# Patient Record
Sex: Female | Born: 1960 | Race: White | Hispanic: No | Smoking: Current some day smoker
Health system: Southern US, Community
[De-identification: ages and names within clinical notes are randomized; demographics above are authoritative.]

## PROBLEM LIST (undated history)

## (undated) ENCOUNTER — Emergency Department (HOSPITAL_BASED_OUTPATIENT_CLINIC_OR_DEPARTMENT_OTHER): Admission: EM | Discharge status: Absent without leave

## (undated) DIAGNOSIS — E079 Disorder of thyroid, unspecified: Secondary | ICD-10-CM

## (undated) DIAGNOSIS — Z87440 Personal history of urinary (tract) infections: Secondary | ICD-10-CM

## (undated) HISTORY — PX: BIOPSY BREAST: PRO8

---

## 2011-03-03 ENCOUNTER — Emergency Department (HOSPITAL_BASED_OUTPATIENT_CLINIC_OR_DEPARTMENT_OTHER)
Admission: EM | Admit: 2011-03-03 | Discharge: 2011-03-03 | Disposition: A | Discharge status: Home / Self Care | Attending: Emergency Medicine | Admitting: Emergency Medicine

## 2011-03-03 DIAGNOSIS — N390 Urinary tract infection, site not specified: Secondary | ICD-10-CM | POA: Insufficient documentation

## 2011-03-03 DIAGNOSIS — R350 Frequency of micturition: Secondary | ICD-10-CM | POA: Insufficient documentation

## 2011-03-03 LAB — PREGNANCY, URINE: Preg Test, Ur: NEGATIVE

## 2011-03-03 LAB — URINE MICROSCOPIC-ADD ON

## 2011-03-03 LAB — URINALYSIS, ROUTINE W REFLEX MICROSCOPIC
Bilirubin Urine: NEGATIVE
Nitrite: NEGATIVE
Protein, ur: NEGATIVE mg/dL
Specific Gravity, Urine: 1.005 (ref 1.005–1.030)
Urobilinogen, UA: 1 mg/dL (ref 0.0–1.0)

## 2011-03-03 MED ORDER — NITROFURANTOIN MONOHYD MACRO 100 MG PO CAPS: 100.0000 mg | ORAL_CAPSULE | Freq: Two times a day (BID) | ORAL | Status: AC

## 2011-03-03 MED ORDER — NITROFURANTOIN MONOHYD MACRO 100 MG PO CAPS
100.0000 mg | ORAL_CAPSULE | Freq: Once | ORAL | Status: AC
  Administered 2011-03-03: 100 mg via ORAL
  Filled 2011-03-03: qty 1

## 2011-03-03 NOTE — ED Provider Notes (Signed)
History  Scribed for [employer]. Yolanda Duel, MD, the patient was seen in [facility]. The chart was scribed by Gilman Schmidt. The patients care was started at 11:11 PM.   CSN: [id number] Arrival date & time: 03/03/2011 10:47 PM   First MD Initiated Contact with Patient 03/03/11 2305      Chief Complaint  Patient presents with  . Urinary Frequency     HPI Yolanda Carr is a 50 y.o. female who presents to the Emergency Department complaining of urinary frequency onset 7 hours ago. Additionally notes pressure on bladder. Denies any discomfort, N/V, fever, back pain, or any other sx. Denies any chance of pregnancy. Pt does report Sulfa allergies. There are no other associated symptoms and no other alleviating or aggravating factors.      History reviewed. No pertinent past medical history.  Past Surgical History  Procedure Date  . Cesarean section 1996    History reviewed. No pertinent family history.  History  Substance Use Topics  . Smoking status: Never Smoker   . Smokeless tobacco: Never Used  . Alcohol Use: 1.2 oz/week    2 Cans of beer per week    OB History    Grav Para Term Preterm Abortions TAB SAB Ect Mult Living                  Review of Systems  Constitutional: Negative for fever and activity change.  Cardiovascular: Negative for chest pain.  Gastrointestinal: Negative for nausea, vomiting, diarrhea and abdominal distention.  Genitourinary: Positive for frequency. Negative for dysuria, decreased urine volume and difficulty urinating.  Musculoskeletal: Negative for back pain.  Neurological: Negative for headaches.  All other systems reviewed and are negative.    Allergies  Sulfa antibiotics  Home Medications   Current Outpatient Rx  Name Route Sig Dispense Refill  . CYSTEX PO Oral Take 2 tablets by mouth once as needed. For UTI symptoms      . TAMOXIFEN CITRATE 10 MG PO TABS Oral Take 10 mg by mouth 2 (two) times daily.        BP 107/74  Pulse 94   Temp(Src) 98.5 F (36.9 C) (Oral)  Resp 16  Ht 5\' 4"  (1.626 m)  Wt 170 lb (77.111 kg)  BMI 29.18 kg/m2  SpO2 96%  LMP 03/03/2011  Physical Exam  Constitutional: She is oriented to person, place, and time. She appears well-developed and well-nourished.  Non-toxic appearance. She does not have a sickly appearance.       Jovial NAD  HENT:  Head: Normocephalic and atraumatic.  Eyes: Conjunctivae, EOM and lids are normal. Pupils are equal, round, and reactive to light. No scleral icterus.  Neck: Trachea normal and normal range of motion. Neck supple.  Cardiovascular: Normal rate, regular rhythm and normal heart sounds.   Pulmonary/Chest: Effort normal and breath sounds normal.  Abdominal: Soft. Normal appearance. There is no tenderness. There is no rebound, no guarding and no CVA tenderness.  Musculoskeletal: Normal range of motion. She exhibits no edema and no tenderness.  Neurological: She is alert and oriented to person, place, and time. She has normal strength.  Skin: Skin is warm, dry and intact. No rash noted.    ED Course  Procedures  DIAGNOSTIC STUDIES: Oxygen Saturation is 96% on room air, normal by my interpretation.    COORDINATION OF CARE: 11:11pm:  - Patient evaluated by ED physician, Macrobid, Pregnancy urine, UA, ordered   Results for orders placed during the hospital encounter of 03/03/11  URINALYSIS, ROUTINE W REFLEX MICROSCOPIC      Component Value Range   Color, Urine RED (*) YELLOW    Appearance CLOUDY (*) CLEAR    Specific Gravity, Urine 1.005  1.005 - 1.030    pH 6.5  5.0 - 8.0    Glucose, UA NEGATIVE  NEGATIVE (mg/dL)   Hgb urine dipstick LARGE (*) NEGATIVE    Bilirubin Urine NEGATIVE  NEGATIVE    Ketones, ur NEGATIVE  NEGATIVE (mg/dL)   Protein, ur NEGATIVE  NEGATIVE (mg/dL)   Urobilinogen, UA 1.0  0.0 - 1.0 (mg/dL)   Nitrite NEGATIVE  NEGATIVE    Leukocytes, UA LARGE (*) NEGATIVE   PREGNANCY, URINE      Component Value Range   Preg Test, Ur  NEGATIVE    URINE MICROSCOPIC-ADD ON      Component Value Range   Squamous Epithelial / LPF FEW (*) RARE    WBC, UA 21-50  <3 (WBC/hpf)   RBC / HPF 11-20  <3 (RBC/hpf)   Bacteria, UA MANY (*) RARE       MDM      I personally performed the services described in this documentation, which was scribed in my presence.  The recorded information has been reviewed and considered. Zylie Mumaw A. Yolanda Duel, MD.       Lorelle Gibbs. Yolanda Duel, MD 03/04/11 1478

## 2011-03-03 NOTE — ED Notes (Signed)
Pt states that she did a test at home for urinary tract infection, said that she had one, here for urinary frequency and discomfort.

## 2011-08-23 ENCOUNTER — Emergency Department (HOSPITAL_BASED_OUTPATIENT_CLINIC_OR_DEPARTMENT_OTHER)
Admission: EM | Admit: 2011-08-23 | Discharge: 2011-08-23 | Disposition: A | Discharge status: Home / Self Care | Attending: Emergency Medicine | Admitting: Emergency Medicine

## 2011-08-23 ENCOUNTER — Encounter (HOSPITAL_BASED_OUTPATIENT_CLINIC_OR_DEPARTMENT_OTHER): Payer: Self-pay | Admitting: Emergency Medicine

## 2011-08-23 DIAGNOSIS — N760 Acute vaginitis: Secondary | ICD-10-CM | POA: Insufficient documentation

## 2011-08-23 DIAGNOSIS — A499 Bacterial infection, unspecified: Secondary | ICD-10-CM | POA: Insufficient documentation

## 2011-08-23 DIAGNOSIS — B9689 Other specified bacterial agents as the cause of diseases classified elsewhere: Secondary | ICD-10-CM | POA: Insufficient documentation

## 2011-08-23 DIAGNOSIS — R350 Frequency of micturition: Secondary | ICD-10-CM | POA: Insufficient documentation

## 2011-08-23 DIAGNOSIS — Z79899 Other long term (current) drug therapy: Secondary | ICD-10-CM | POA: Insufficient documentation

## 2011-08-23 DIAGNOSIS — N921 Excessive and frequent menstruation with irregular cycle: Secondary | ICD-10-CM | POA: Insufficient documentation

## 2011-08-23 DIAGNOSIS — N949 Unspecified condition associated with female genital organs and menstrual cycle: Secondary | ICD-10-CM | POA: Insufficient documentation

## 2011-08-23 LAB — URINALYSIS, ROUTINE W REFLEX MICROSCOPIC
Hgb urine dipstick: NEGATIVE
Ketones, ur: 15 mg/dL — AB
Protein, ur: 30 mg/dL — AB
Urobilinogen, UA: 1 mg/dL (ref 0.0–1.0)

## 2011-08-23 LAB — URINE MICROSCOPIC-ADD ON

## 2011-08-23 LAB — WET PREP, GENITAL
Trich, Wet Prep: NONE SEEN
Yeast Wet Prep HPF POC: NONE SEEN

## 2011-08-23 LAB — PREGNANCY, URINE: Preg Test, Ur: NEGATIVE

## 2011-08-23 MED ORDER — FLUCONAZOLE 50 MG PO TABS
150.0000 mg | ORAL_TABLET | Freq: Once | ORAL | Status: AC
  Administered 2011-08-23: 150 mg via ORAL
  Filled 2011-08-23: qty 1

## 2011-08-23 MED ORDER — METRONIDAZOLE 500 MG PO TABS: 500.0000 mg | ORAL_TABLET | Freq: Two times a day (BID) | ORAL | Status: AC

## 2011-08-23 MED ORDER — METRONIDAZOLE 500 MG PO TABS
500.0000 mg | ORAL_TABLET | Freq: Once | ORAL | Status: AC
  Administered 2011-08-23: 500 mg via ORAL
  Filled 2011-08-23: qty 1

## 2011-08-23 NOTE — Discharge Instructions (Signed)
Bacterial Vaginosis Bacterial vaginosis (BV) is a vaginal infection where the normal balance of bacteria in the vagina is disrupted. The normal balance is then replaced by an overgrowth of certain bacteria. There are several different kinds of bacteria that can cause BV. BV is the most common vaginal infection in women of childbearing age. CAUSES   The cause of BV is not fully understood. BV develops when there is an increase or imbalance of harmful bacteria.   Some activities or behaviors can upset the normal balance of bacteria in the vagina and put women at increased risk including:   Having a new sex partner or multiple sex partners.   Douching.   Using an intrauterine device (IUD) for contraception.   It is not clear what role sexual activity plays in the development of BV. However, women that have never had sexual intercourse are rarely infected with BV.  Women do not get BV from toilet seats, bedding, swimming pools or from touching objects around them.  SYMPTOMS   Grey vaginal discharge.   A fish-like odor with discharge, especially after sexual intercourse.   Itching or burning of the vagina and vulva.   Burning or pain with urination.   Some women have no signs or symptoms at all.  DIAGNOSIS  Your caregiver must examine the vagina for signs of BV. Your caregiver will perform lab tests and look at the sample of vaginal fluid through a microscope. They will look for bacteria and abnormal cells (clue cells), a pH test higher than 4.5, and a positive amine test all associated with BV.  RISKS AND COMPLICATIONS   Pelvic inflammatory disease (PID).   Infections following gynecology surgery.   Developing HIV.   Developing herpes virus.  TREATMENT  Sometimes BV will clear up without treatment. However, all women with symptoms of BV should be treated to avoid complications, especially if gynecology surgery is planned. Female partners generally do not need to be treated. However,  BV may spread between female sex partners so treatment is helpful in preventing a recurrence of BV.   BV may be treated with antibiotics. The antibiotics come in either pill or vaginal cream forms. Either can be used with nonpregnant or pregnant women, but the recommended dosages differ. These antibiotics are not harmful to the baby.   BV can recur after treatment. If this happens, a second round of antibiotics will often be prescribed.   Treatment is important for pregnant women. If not treated, BV can cause a premature delivery, especially for a pregnant woman who had a premature birth in the past. All pregnant women who have symptoms of BV should be checked and treated.   For chronic reoccurrence of BV, treatment with a type of prescribed gel vaginally twice a week is helpful.  HOME CARE INSTRUCTIONS   Finish all medication as directed by your caregiver.   Do not have sex until treatment is completed.   Tell your sexual partner that you have a vaginal infection. They should see their caregiver and be treated if they have problems, such as a mild rash or itching.   Practice safe sex. Use condoms. Only have 1 sex partner.  PREVENTION  Basic prevention steps can help reduce the risk of upsetting the natural balance of bacteria in the vagina and developing BV:  Do not have sexual intercourse (be abstinent).   Do not douche.   Use all of the medicine prescribed for treatment of BV, even if the signs and symptoms go away.     Tell your sex partner if you have BV. That way, they can be treated, if needed, to prevent reoccurrence.  SEEK MEDICAL CARE IF:   Your symptoms are not improving after 3 days of treatment.   You have increased discharge, pain, or fever.  MAKE SURE YOU:   Understand these instructions.   Will watch your condition.   Will get help right away if you are not doing well or get worse.  FOR MORE INFORMATION  Division of STD Prevention (DSTDP), Centers for Disease  Control and Prevention: SolutionApps.co.za American Social Health Association (ASHA): www.ashastd.org  Document Released: 04/02/2005 Document Revised: 03/22/2011 Document Reviewed: 09/23/2008 [facility] Patient Information 2012 [place], [place].Metrorrhagia  Metrorrhagia is uterine bleeding at irregular intervals, especially between menstrual periods.  CAUSES   Dysfunctional uterine bleeding.   Uterine lining growing outside the uterus (endometriosis).   Embryo adhering to uterine wall (implantation).   Pregnancy growing in the fallopian tubes (ectopic pregnancy).   Miscarriage.   Menopause.   Cancer of the reproduction organs.   Certain drugs such as hormonal contraceptives.   Inherited bleeding disorders.   Trauma.   Uterine fibroids.   Sexually transmitted diseases (STDs).   Polycystic ovarian disease.  DIAGNOSIS  A history will be taken.   A physical exam will be performed.   Other tests may include:   Blood tests.   A pregnancy test.   An ultrasound of the abdomen and pelvis.   A biopsy of the uterine lining.   AMRI or CT scan of the abdomen and pelvis.  TREATMENT Treatment will depend on the cause. HOME CARE INSTRUCTIONS   Take all medicines as directed by your caregiver. Do not change or switch medicines without talking to your caregiver.   Take all iron supplements exactly as directed by your caregiver. Iron supplements help to replace the iron your body loses from irregular bleeding.If you become constipated, increase the amount of fiber, fruits, and vegetables in your diet.   Do not take aspirin or medicines that contain aspirin for 1 week before your menstrual period or during your menstrual period. Aspirin may increase the bleeding.   Rest as much as possible if you change your sanitary pad or tampon more than once every 2 hours.   Eat well-balanced meals including foods high in iron, such as green leafy vegetables, red meat, liver, eggs, and  whole-grain breads and cereals.   Do not try to lose weight until the abnormal bleeding is controlled and your blood iron level is back to normal.  SEEK MEDICAL CARE IF:   You have nausea and vomiting, or you cannot keep foods down.   You feel dizzy or have diarrhea while taking medicine.   You have any problems that may be related to the medicine you are taking.  SEEK IMMEDIATE MEDICAL CARE IF:   You have a fever.   You develop chills.   You become lightheaded or faint.   You need to change your sanitary pad or tampon more than once an hour.   Your bleeding becomesheavy.   You begin to pass clots or tissue.  MAKE SURE YOU:   Understand these instructions.   Will watch your condition.   Will get help right away if you are not doing well or get worse.  Document Released: 04/02/2005 Document Revised: 03/22/2011 Document Reviewed: 10/30/2010 [facility] Patient Information 2012 [place], [place].Vaginitis Vaginitis is an infection. It causes soreness, swelling, and redness (inflammation) of the vagina. Many of these infections are sexually transmitted  diseases (STDs). Having unprotected sex can cause further problems and complications such as:  Chronic pelvic pain.   Infertility.   Unwanted pregnancy.   Abortion.   Tubal pregnancy.   Infection passed on to the newborn.   Cancer.  CAUSES   Monilia. This is a yeast or fungus infection, not an STD.   Bacterial vaginosis. The normal balance of bacteria in the vagina is disrupted and is replaced by an overgrowth of certain bacteria.   Gonorrhea, chlamydia. These are bacterial infections that are STDs.   Vaginal sponges, diaphragms, and intrauterine devices.   Trichomoniasis. This is a STD infection caused by a parasite.   Viruses like herpes and human papillomavirus. Both are STDs.   Pregnancy.   Immunosuppression. This occurs with certain conditions such as HIV infection or cancer.   Using bubble bath.   Taking  certain antibiotic medicines.   Sporadic recurrence can occur if you become sick.   Diabetes.   Steroids.   Allergic reaction. If you have an allergy to:   Douches.   Soaps.   Spermicides.   Condoms.   Scented tampons or vaginal sprays.  SYMPTOMS   Abnormal vaginal discharge.   Itching of the vagina.   Pain in the vagina.   Swelling of the vagina.  In some cases, there are no symptoms. TREATMENT  Treatment will vary depending on the type of infection.  Bacteria or trichomonas are usually treated with oral antibiotics and sometimes vaginal cream or suppositories.   Monilia vaginitis is usually treated with vaginal creams, suppositories, or oral antifungal pills.   Viral vaginitis has no cure. However, the symptoms of herpes (a viral vaginitis) can be treated to relieve the discomfort. Human papillomavirus has no symptoms. However, there are treatments for the diseases caused by human papillomavirus.   With allergic vaginitis, you need to stop using the product that is causing the problem. Vaginal creams can be used to treat the symptoms.   When treating an STD, the sex partner should also be treated.  HOME CARE INSTRUCTIONS   Take all the medicines as directed by your caregiver.   Do not use scented tampons, soaps, or vaginal sprays.   Do not douche.   Tell your sex partner if you have a vaginal infection or an STD.   Do not have sexual intercourse until you have treated the vaginitis.   Practice safe sex by using condoms.  SEEK MEDICAL CARE IF:   You have abdominal pain.   Your symptoms get worse during treatment.  Document Released: 01/28/2007 Document Revised: 03/22/2011 Document Reviewed: 09/23/2008 [facility] Patient Information 2012 [place], [place].

## 2011-08-23 NOTE — ED Notes (Signed)
Pt amb to room 7 with quick steady gait smiling in nad. Pt reports her period is 2 weeks late, and wonders if she is pregnant. Also reports dysuria, and some white vaginal discharge.

## 2011-08-23 NOTE — ED Provider Notes (Signed)
History     CSN: [id number]  Arrival date & time 08/23/11  1125   First MD Initiated Contact with Patient 08/23/11 1132      Chief Complaint  Patient presents with  . Dysuria    (Consider location/radiation/quality/duration/timing/severity/associated sxs/prior treatment) Patient is a 51 y.o. female presenting with vaginal discharge.  Vaginal Discharge This is a new problem. The current episode started more than 2 days ago. The problem occurs constantly. The problem has not changed since onset.Pertinent negatives include no abdominal pain. The symptoms are aggravated by nothing. The symptoms are relieved by nothing. She has tried nothing for the symptoms.   patient complains of 4/10 vaginal discomfort. She notes that she feels that her external vaginal area is swollen. Patient has had the same partner for some time and is not concerned that she did have a sexual transmitted disease. She does note that her husband had similar symptoms and treated himself with nystatin powder and it seemed to have helped. Patient's vaginal discharge is noted to be slightly whitish. She denies any increased urinary frequency but did note some discomfort on urination just today. Patient denies any back or abdominal pain with this. She states that her period is 2 weeks late which is very unusual for her. She also notes having hot and cold flashes. Patient has not gone through menopause as far she knows. There are no other associated or modifying factors.  History reviewed. No pertinent past medical history.  Past Surgical History  Procedure Date  . Cesarean section 1996    History reviewed. No pertinent family history.  History  Substance Use Topics  . Smoking status: Never Smoker   . Smokeless tobacco: Never Used  . Alcohol Use: 1.2 oz/week    2 Cans of beer per week    OB History    Grav Para Term Preterm Abortions TAB SAB Ect Mult Living                  Review of Systems  Constitutional:  Negative.   HENT: Negative.   Eyes: Negative.   Respiratory: Negative.   Cardiovascular: Negative.   Gastrointestinal: Negative.  Negative for abdominal pain.  Genitourinary: Positive for dysuria and vaginal discharge.  Musculoskeletal: Negative.   Neurological: Negative.   Hematological: Negative.   Psychiatric/Behavioral: Negative.   All other systems reviewed and are negative.    Allergies  Sulfa antibiotics  Home Medications   Current Outpatient Rx  Name Route Sig Dispense Refill  . CYSTEX PO Oral Take 2 tablets by mouth once as needed. For UTI symptoms      . METRONIDAZOLE 500 MG PO TABS Oral Take 1 tablet (500 mg total) by mouth 2 (two) times daily. 14 tablet 0  . TAMOXIFEN CITRATE 10 MG PO TABS Oral Take 10 mg by mouth 2 (two) times daily.        BP 112/74  Pulse 65  Temp(Src) 97.5 F (36.4 C) (Oral)  Resp 20  Ht 5\' 4"  (1.626 m)  Wt 170 lb (77.111 kg)  BMI 29.18 kg/m2  SpO2 99%  LMP 07/14/2011  Physical Exam  Nursing note and vitals reviewed. Constitutional: She appears well-developed and well-nourished. No distress.  HENT:  Head: Normocephalic and atraumatic.  Eyes: Conjunctivae and EOM are normal. Pupils are equal, round, and reactive to light.  Neck: Normal range of motion.  Cardiovascular: Normal rate, regular rhythm and normal heart sounds.   Pulmonary/Chest: Effort normal and breath sounds normal. No respiratory distress. She  has no wheezes. She has no rales.  Abdominal: Soft. Bowel sounds are normal. She exhibits no distension. There is no tenderness. There is no rebound and no guarding.  Genitourinary: Uterus normal. Pelvic exam was performed with patient supine. Cervix exhibits no motion tenderness and no discharge. Right adnexum displays no tenderness. Left adnexum displays no tenderness. There is erythema and tenderness around the vagina. Vaginal discharge found.    ED Course  Procedures (including critical care time)  Labs Reviewed  WET PREP,  GENITAL - Abnormal; Notable for the following:    Clue Cells Wet Prep HPF POC TOO NUMEROUS TO COUNT (*)    WBC, Wet Prep HPF POC FEW (*)    All other components within normal limits  URINALYSIS, ROUTINE W REFLEX MICROSCOPIC - Abnormal; Notable for the following:    Color, Urine AMBER (*) BIOCHEMICALS MAY BE AFFECTED BY COLOR   APPearance CLOUDY (*)    Specific Gravity, Urine 1.037 (*)    Bilirubin Urine SMALL (*)    Ketones, ur 15 (*)    Protein, ur 30 (*)    Leukocytes, UA TRACE (*)    All other components within normal limits  URINE MICROSCOPIC-ADD ON - Abnormal; Notable for the following:    Squamous Epithelial / LPF MANY (*)    Bacteria, UA MANY (*)    Crystals CA OXALATE CRYSTALS (*)    All other components within normal limits  PREGNANCY, URINE  GC/CHLAMYDIA PROBE AMP, GENITAL   No results found.   1. Vulvovaginitis   2. Bacterial vaginitis   3. Metrorrhagia       MDM  Patient had negative urine pregnancy test and urinalysis that was not a definitive urinary tract infection. The patient had calcium oxalate crystals noted in her urine symptoms were not consistent with nephrolithiasis. Patient had pelvic exam with some whitish discharge consistent with yeast infection. Wet prep was positive for clue cells suggesting bacterial vaginosis. The patient did not have yeast in her wet prep her clinical exam as well as her report of her husband having improvement with use of nystatin suggested possible candidal involvement. Patient was given a dose of Diflucan as well as metronidazole here. She was discharged with a seven-day prescription for Flagyl. Patient can followup with her primary care provider as needed or with her OB/GYN following completion of her antibiotics. Patient was discharged in good condition.        Cyndra Numbers, MD 08/23/11 1329

## 2011-08-24 LAB — GC/CHLAMYDIA PROBE AMP, GENITAL: GC Probe Amp, Genital: NEGATIVE

## 2011-10-11 ENCOUNTER — Emergency Department (HOSPITAL_BASED_OUTPATIENT_CLINIC_OR_DEPARTMENT_OTHER)
Admission: EM | Admit: 2011-10-11 | Discharge: 2011-10-11 | Disposition: A | Discharge status: Home / Self Care | Attending: Emergency Medicine | Admitting: Emergency Medicine

## 2011-10-11 ENCOUNTER — Encounter (HOSPITAL_BASED_OUTPATIENT_CLINIC_OR_DEPARTMENT_OTHER): Payer: Self-pay | Admitting: *Deleted

## 2011-10-11 DIAGNOSIS — N760 Acute vaginitis: Secondary | ICD-10-CM | POA: Insufficient documentation

## 2011-10-11 DIAGNOSIS — Z881 Allergy status to other antibiotic agents status: Secondary | ICD-10-CM | POA: Insufficient documentation

## 2011-10-11 LAB — URINALYSIS, ROUTINE W REFLEX MICROSCOPIC
Bilirubin Urine: NEGATIVE
Glucose, UA: NEGATIVE mg/dL
Hgb urine dipstick: NEGATIVE
Ketones, ur: NEGATIVE mg/dL
Specific Gravity, Urine: 1.012 (ref 1.005–1.030)
pH: 5.5 (ref 5.0–8.0)

## 2011-10-11 LAB — WET PREP, GENITAL
Clue Cells Wet Prep HPF POC: NONE SEEN
Trich, Wet Prep: NONE SEEN
Yeast Wet Prep HPF POC: NONE SEEN

## 2011-10-11 NOTE — ED Notes (Signed)
Pt c/o yeast infection x3 days  

## 2011-10-11 NOTE — ED Provider Notes (Signed)
History     CSN: [id number]  Arrival date & time 10/11/11  0005   First MD Initiated Contact with Patient 10/11/11 0040      Chief Complaint  Patient presents with  . Vaginitis    (Consider location/radiation/quality/duration/timing/severity/associated sxs/prior treatment) Patient is a 51 y.o. female presenting with vaginal itching. The history is provided by the patient.  Vaginal Itching This is a recurrent problem. The current episode started yesterday. The problem occurs constantly. The problem has not changed since onset.Pertinent negatives include no chest pain, no abdominal pain, no headaches and no shortness of breath. Nothing aggravates the symptoms. Nothing relieves the symptoms. Treatments tried: monostat. The treatment provided no relief.    History reviewed. No pertinent past medical history.  Past Surgical History  Procedure Date  . Cesarean section 1996    History reviewed. No pertinent family history.  History  Substance Use Topics  . Smoking status: Never Smoker   . Smokeless tobacco: Never Used  . Alcohol Use: 1.2 oz/week    2 Cans of beer per week    OB History    Grav Para Term Preterm Abortions TAB SAB Ect Mult Living                  Review of Systems  Respiratory: Negative for shortness of breath.   Cardiovascular: Negative for chest pain.  Gastrointestinal: Negative for abdominal pain.  Genitourinary: Positive for vaginal discharge. Negative for pelvic pain.  Neurological: Negative for headaches.  All other systems reviewed and are negative.    Allergies  Sulfa antibiotics  Home Medications   Current Outpatient Rx  Name Route Sig Dispense Refill  . CYSTEX PO Oral Take 2 tablets by mouth once as needed. For UTI symptoms      . TAMOXIFEN CITRATE 10 MG PO TABS Oral Take 10 mg by mouth 2 (two) times daily.        BP 118/73  Pulse 94  Temp 98.1 F (36.7 C)  Resp 16  Ht 5\' 4"  (1.626 m)  Wt 170 lb (77.111 kg)  BMI 29.18 kg/m2   SpO2 99%  LMP 09/12/2011  Physical Exam  Constitutional: She is oriented to person, place, and time. She appears well-developed and well-nourished.  HENT:  Head: Normocephalic and atraumatic.  Mouth/Throat: Oropharynx is clear and moist.  Eyes: Conjunctivae are normal. Pupils are equal, round, and reactive to light.  Neck: Normal range of motion. Neck supple.  Cardiovascular: Normal rate and regular rhythm.   Pulmonary/Chest: Effort normal and breath sounds normal. She has no wheezes. She has no rales.  Abdominal: Soft. Bowel sounds are normal. There is no tenderness. There is no rebound and no guarding.  Musculoskeletal: Normal range of motion.  Neurological: She is alert and oriented to person, place, and time.  Skin: Skin is warm and dry.  Psychiatric: She has a normal mood and affect.    ED Course  Procedures (including critical care time)  Labs Reviewed  URINALYSIS, ROUTINE W REFLEX MICROSCOPIC - Abnormal; Notable for the following:    APPearance CLOUDY (*)     All other components within normal limits  WET PREP, GENITAL  GC/CHLAMYDIA PROBE AMP, GENITAL   No results found.   1. Vaginitis       MDM  No signs of infection.  Follow up with your GYN      Roshon Duell K Jerard Bays-Rasch, MD 10/11/11 [phone]

## 2011-10-12 LAB — GC/CHLAMYDIA PROBE AMP, GENITAL: Chlamydia, DNA Probe: NEGATIVE

## 2012-04-22 ENCOUNTER — Emergency Department (HOSPITAL_BASED_OUTPATIENT_CLINIC_OR_DEPARTMENT_OTHER)
Admission: EM | Admit: 2012-04-22 | Discharge: 2012-04-22 | Disposition: A | Discharge status: Home / Self Care | Attending: Emergency Medicine | Admitting: Emergency Medicine

## 2012-04-22 ENCOUNTER — Encounter (HOSPITAL_BASED_OUTPATIENT_CLINIC_OR_DEPARTMENT_OTHER): Payer: Self-pay | Admitting: *Deleted

## 2012-04-22 DIAGNOSIS — Z87440 Personal history of urinary (tract) infections: Secondary | ICD-10-CM | POA: Insufficient documentation

## 2012-04-22 DIAGNOSIS — R300 Dysuria: Secondary | ICD-10-CM | POA: Insufficient documentation

## 2012-04-22 DIAGNOSIS — N390 Urinary tract infection, site not specified: Secondary | ICD-10-CM | POA: Insufficient documentation

## 2012-04-22 DIAGNOSIS — R1033 Periumbilical pain: Secondary | ICD-10-CM | POA: Insufficient documentation

## 2012-04-22 DIAGNOSIS — R3915 Urgency of urination: Secondary | ICD-10-CM | POA: Insufficient documentation

## 2012-04-22 HISTORY — DX: Personal history of urinary (tract) infections: Z87.440

## 2012-04-22 LAB — URINE MICROSCOPIC-ADD ON

## 2012-04-22 LAB — URINALYSIS, ROUTINE W REFLEX MICROSCOPIC
Bilirubin Urine: NEGATIVE
Glucose, UA: NEGATIVE mg/dL
Specific Gravity, Urine: 1.019 (ref 1.005–1.030)
Urobilinogen, UA: 1 mg/dL (ref 0.0–1.0)
pH: 5.5 (ref 5.0–8.0)

## 2012-04-22 MED ORDER — NITROFURANTOIN MONOHYD MACRO 100 MG PO CAPS: 100.0000 mg | ORAL_CAPSULE | Freq: Two times a day (BID) | ORAL | Status: DC

## 2012-04-22 NOTE — ED Notes (Signed)
UTI. States she has frequent UTIs since menopause.

## 2012-04-22 NOTE — ED Provider Notes (Signed)
History     CSN: [id number]  Arrival date & time 04/22/12  1344   First MD Initiated Contact with Patient 04/22/12 1355      Chief Complaint  Patient presents with  . Urinary Tract Infection    (Consider location/radiation/quality/duration/timing/severity/associated sxs/prior treatment) Patient is a 52 y.o. female presenting with frequency.  Urinary Frequency Chronicity: Pt recognized symptoms of UTI starting 2 days ago, freqeuncy, urgency, bloating, salty odor, and pain increasing over the subsequent 24 hours.  Associated symptoms include abdominal pain. Pertinent negatives include no chills, diaphoresis, fever, headaches, joint swelling, nausea, neck pain, numbness, rash, vomiting or weakness. Associated symptoms comments: Pt denies hematuria, nausea, vomiting, fever, flank pain, dizziness.. Treatments tried: Pt took Pyridium left over from a previous UTI which offered her relief from the pain. Pt also drank cranberry juice.     Past Medical History  Diagnosis Date  . History of recurrent UTIs     Past Surgical History  Procedure Date  . Cesarean section 1996    No family history on file.  History  Substance Use Topics  . Smoking status: Never Smoker   . Smokeless tobacco: Never Used  . Alcohol Use: 1.2 oz/week    2 Cans of beer per week    OB History    Grav Para Term Preterm Abortions TAB SAB Ect Mult Living                  Review of Systems  Constitutional: Negative for fever, chills and diaphoresis.  HENT: Negative for neck pain and neck stiffness.   Gastrointestinal: Positive for abdominal pain. Negative for nausea, vomiting, diarrhea, constipation and blood in stool.       Bloating  Genitourinary: Positive for dysuria, urgency, frequency and difficulty urinating. Negative for hematuria, flank pain, vaginal discharge, vaginal pain and pelvic pain.  Musculoskeletal: Negative for back pain and joint swelling.  Skin: Negative for rash.  Neurological: Negative  for dizziness, weakness, light-headedness, numbness and headaches.  All other systems reviewed and are negative.    Allergies  Sulfa antibiotics  Home Medications   Current Outpatient Rx  Name  Route  Sig  Dispense  Refill  . CYSTEX PO   Oral   Take 2 tablets by mouth once as needed. For UTI symptoms           . TAMOXIFEN CITRATE 10 MG PO TABS   Oral   Take 10 mg by mouth 2 (two) times daily.             BP 132/82  Pulse 92  Temp 98 F (36.7 C) (Oral)  Resp 20  SpO2 96%  Physical Exam  Nursing note and vitals reviewed. Constitutional: She is oriented to person, place, and time. She appears well-developed and well-nourished. No distress.  HENT:  Head: Normocephalic and atraumatic.  Neck: Normal range of motion. Neck supple.  Cardiovascular: Normal rate, regular rhythm and normal heart sounds.  Exam reveals no gallop and no friction rub.   No murmur heard. Pulmonary/Chest: Effort normal and breath sounds normal. No respiratory distress. She has no wheezes. She has no rales. She exhibits no tenderness.  Abdominal: Soft. Bowel sounds are normal. She exhibits no distension. There is no tenderness.       No flank pain  Musculoskeletal: Normal range of motion. She exhibits no edema and no tenderness.  Neurological: She is alert and oriented to person, place, and time.  Skin: Skin is warm and dry. She is not  diaphoretic.    ED Course  Procedures (including critical care time)   Labs Reviewed  URINALYSIS, ROUTINE W REFLEX MICROSCOPIC   Results for orders placed during the hospital encounter of 04/22/12  URINALYSIS, ROUTINE W REFLEX MICROSCOPIC      Component Value Range   Color, Urine ORANGE (*) YELLOW   APPearance CLOUDY (*) CLEAR   Specific Gravity, Urine 1.019  1.005 - 1.030   pH 5.5  5.0 - 8.0   Glucose, UA NEGATIVE  NEGATIVE mg/dL   Hgb urine dipstick TRACE (*) NEGATIVE   Bilirubin Urine NEGATIVE  NEGATIVE   Ketones, ur 15 (*) NEGATIVE mg/dL   Protein,  ur NEGATIVE  NEGATIVE mg/dL   Urobilinogen, UA 1.0  0.0 - 1.0 mg/dL   Nitrite POSITIVE (*) NEGATIVE   Leukocytes, UA LARGE (*) NEGATIVE  URINE MICROSCOPIC-ADD ON      Component Value Range   Squamous Epithelial / LPF RARE  RARE   WBC, UA TOO NUMEROUS TO COUNT  <3 WBC/hpf   RBC / HPF 3-6  <3 RBC/hpf   Bacteria, UA MANY (*) RARE    Diagnosis: UTI    MDM  Denies flank pain, hematuria, nausea/vomiting. Pt has had several UTIs since menopause and recognized symptoms (suprapubic pain, urgency, frequency) so took some Pyridium last night that she had left over from a previous UTI for pain and came in today for treatment. Sulfa allergy. Pt prescribed Macrobid with success in the past. Pt agreeable to treatment plan with Macrobid; directed to follow up with PCP or return to ED if symptoms persist or worsen with fever, flank pain, nausea/vomiting.    Glade Nurse, PA-C 04/22/12 1858

## 2012-04-23 NOTE — ED Provider Notes (Signed)
Medical screening examination/treatment/procedure(s) were performed by non-physician practitioner and as supervising physician I was immediately available for consultation/collaboration.   Charles B. Bernette Mayers, MD 04/23/12 [phone]

## 2012-04-24 LAB — URINE CULTURE

## 2012-04-25 NOTE — ED Notes (Addendum)
Diflucan 150 mg PO x 1 dose called into [facility] pharmacy at [phone].  No refills.  Per Dahlia Client Muthersbaugh PA-C

## 2012-04-25 NOTE — ED Notes (Addendum)
+   Urine] Patient treated with Macrobid-sensitive to same-chart appended per protocol MD. 

## 2012-05-03 ENCOUNTER — Encounter (HOSPITAL_BASED_OUTPATIENT_CLINIC_OR_DEPARTMENT_OTHER): Payer: Self-pay | Admitting: Emergency Medicine

## 2012-05-03 ENCOUNTER — Emergency Department (HOSPITAL_BASED_OUTPATIENT_CLINIC_OR_DEPARTMENT_OTHER)
Admission: EM | Admit: 2012-05-03 | Discharge: 2012-05-03 | Disposition: A | Discharge status: Home / Self Care | Attending: Emergency Medicine | Admitting: Emergency Medicine

## 2012-05-03 DIAGNOSIS — R3915 Urgency of urination: Secondary | ICD-10-CM | POA: Insufficient documentation

## 2012-05-03 DIAGNOSIS — R350 Frequency of micturition: Secondary | ICD-10-CM | POA: Insufficient documentation

## 2012-05-03 DIAGNOSIS — N390 Urinary tract infection, site not specified: Secondary | ICD-10-CM | POA: Insufficient documentation

## 2012-05-03 DIAGNOSIS — Z9889 Other specified postprocedural states: Secondary | ICD-10-CM | POA: Insufficient documentation

## 2012-05-03 DIAGNOSIS — Z3202 Encounter for pregnancy test, result negative: Secondary | ICD-10-CM | POA: Insufficient documentation

## 2012-05-03 DIAGNOSIS — Z79899 Other long term (current) drug therapy: Secondary | ICD-10-CM | POA: Insufficient documentation

## 2012-05-03 LAB — URINALYSIS, ROUTINE W REFLEX MICROSCOPIC
Glucose, UA: NEGATIVE mg/dL
Ketones, ur: NEGATIVE mg/dL
Nitrite: NEGATIVE
Protein, ur: NEGATIVE mg/dL
pH: 6 (ref 5.0–8.0)

## 2012-05-03 LAB — URINE MICROSCOPIC-ADD ON

## 2012-05-03 LAB — PREGNANCY, URINE: Preg Test, Ur: NEGATIVE

## 2012-05-03 MED ORDER — FLUCONAZOLE 200 MG PO TABS: 200.0000 mg | ORAL_TABLET | Freq: Once | ORAL | Status: AC

## 2012-05-03 MED ORDER — CIPROFLOXACIN HCL 250 MG PO TABS: 250.0000 mg | ORAL_TABLET | Freq: Two times a day (BID) | ORAL | Status: DC

## 2012-05-03 NOTE — ED Provider Notes (Signed)
History     CSN: [id number]  Arrival date & time 05/03/12  0919   First MD Initiated Contact with Patient 05/03/12 [phone]      Chief Complaint  Patient presents with  . Dysuria    (Consider location/radiation/quality/duration/timing/severity/associated sxs/prior treatment) HPI Pt seen and treated for UTI on 1/7 stating that she just finished 5 day course of macrobid several days ago and started having similar symptoms or urinary urgency and dysuria last night. Pt states she took a left over clindamycin. No fever, chills, flank pain, N/V, abd pain, vaginal symptoms.  Past Medical History  Diagnosis Date  . History of recurrent UTIs     Past Surgical History  Procedure Date  . Cesarean section 1996    No family history on file.  History  Substance Use Topics  . Smoking status: Never Smoker   . Smokeless tobacco: Never Used  . Alcohol Use: 1.2 oz/week    2 Cans of beer per week    OB History    Grav Para Term Preterm Abortions TAB SAB Ect Mult Living                  Review of Systems  Constitutional: Negative for fever and chills.  Gastrointestinal: Negative for nausea, vomiting and abdominal pain.  Genitourinary: Positive for dysuria and frequency. Negative for flank pain, vaginal bleeding, vaginal discharge and pelvic pain.    Allergies  Sulfa antibiotics  Home Medications   Current Outpatient Rx  Name  Route  Sig  Dispense  Refill  . CIPROFLOXACIN HCL 250 MG PO TABS   Oral   Take 1 tablet (250 mg total) by mouth every 12 (twelve) hours.   14 tablet   0   . CYSTEX PO   Oral   Take 2 tablets by mouth once as needed. For UTI symptoms           . NITROFURANTOIN MONOHYD MACRO 100 MG PO CAPS   Oral   Take 1 capsule (100 mg total) by mouth 2 (two) times daily.   10 capsule   0   . TAMOXIFEN CITRATE 10 MG PO TABS   Oral   Take 10 mg by mouth 2 (two) times daily.             BP 102/86  Pulse 94  Temp 98.2 F (36.8 C)  Resp 16  SpO2 100%   LMP 04/12/2012  Physical Exam  Nursing note and vitals reviewed. Constitutional: She is oriented to person, place, and time. She appears well-developed and well-nourished. No distress.  HENT:  Head: Normocephalic and atraumatic.  Mouth/Throat: Oropharynx is clear and moist.  Eyes: EOM are normal. Pupils are equal, round, and reactive to light.  Neck: Normal range of motion. Neck supple.  Cardiovascular: Normal rate and regular rhythm.   Pulmonary/Chest: Effort normal and breath sounds normal. No respiratory distress. She has no wheezes. She has no rales.  Abdominal: Soft. Bowel sounds are normal. She exhibits no mass. There is no tenderness. There is no rebound and no guarding.  Musculoskeletal: Normal range of motion. She exhibits no edema and no tenderness.       No CVAT  Neurological: She is alert and oriented to person, place, and time.  Skin: Skin is warm and dry. No rash noted. No erythema.  Psychiatric: She has a normal mood and affect. Her behavior is normal.    ED Course  Procedures (including critical care time)  Labs Reviewed  URINALYSIS, ROUTINE  W REFLEX MICROSCOPIC - Abnormal; Notable for the following:    APPearance CLOUDY (*)     Hgb urine dipstick TRACE (*)     Leukocytes, UA MODERATE (*)     All other components within normal limits  URINE MICROSCOPIC-ADD ON - Abnormal; Notable for the following:    Squamous Epithelial / LPF FEW (*)     Bacteria, UA FEW (*)     All other components within normal limits  PREGNANCY, URINE  URINE CULTURE   No results found.   1. UTI (urinary tract infection)       MDM  Urine grew E coli which was pan-sensitive.        Loren Racer, MD 05/03/12 [phone]

## 2012-05-03 NOTE — ED Notes (Signed)
Pt recently treated for UTI.  Pt continues to have symptoms.

## 2012-05-06 LAB — URINE CULTURE: Colony Count: >=100000

## 2012-05-07 NOTE — ED Notes (Signed)
+   Urine Patient treated with cipro-sensitive to same-chart appended per protocol MD. 

## 2012-07-05 ENCOUNTER — Encounter (HOSPITAL_BASED_OUTPATIENT_CLINIC_OR_DEPARTMENT_OTHER): Payer: Self-pay | Admitting: *Deleted

## 2012-07-05 ENCOUNTER — Emergency Department (HOSPITAL_BASED_OUTPATIENT_CLINIC_OR_DEPARTMENT_OTHER)
Admission: EM | Admit: 2012-07-05 | Discharge: 2012-07-06 | Disposition: A | Discharge status: Home / Self Care | Attending: Emergency Medicine | Admitting: Emergency Medicine

## 2012-07-05 DIAGNOSIS — Z79899 Other long term (current) drug therapy: Secondary | ICD-10-CM | POA: Insufficient documentation

## 2012-07-05 DIAGNOSIS — Z87440 Personal history of urinary (tract) infections: Secondary | ICD-10-CM | POA: Insufficient documentation

## 2012-07-05 DIAGNOSIS — R3919 Other difficulties with micturition: Secondary | ICD-10-CM | POA: Insufficient documentation

## 2012-07-05 DIAGNOSIS — N390 Urinary tract infection, site not specified: Secondary | ICD-10-CM | POA: Insufficient documentation

## 2012-07-05 DIAGNOSIS — Z3202 Encounter for pregnancy test, result negative: Secondary | ICD-10-CM | POA: Insufficient documentation

## 2012-07-05 DIAGNOSIS — Z87898 Personal history of other specified conditions: Secondary | ICD-10-CM

## 2012-07-05 DIAGNOSIS — R350 Frequency of micturition: Secondary | ICD-10-CM | POA: Insufficient documentation

## 2012-07-05 LAB — URINALYSIS, ROUTINE W REFLEX MICROSCOPIC
Bilirubin Urine: NEGATIVE
Ketones, ur: NEGATIVE mg/dL
Leukocytes, UA: NEGATIVE
Nitrite: NEGATIVE
Protein, ur: NEGATIVE mg/dL
Urobilinogen, UA: 1 mg/dL (ref 0.0–1.0)

## 2012-07-05 MED ORDER — CIPROFLOXACIN HCL 500 MG PO TABS: 500.0000 mg | ORAL_TABLET | Freq: Two times a day (BID) | ORAL | Status: DC

## 2012-07-05 NOTE — ED Notes (Signed)
Pt states she has a hx of UTI's and knows that she has one now. S/S x 2 days

## 2012-07-05 NOTE — ED Provider Notes (Signed)
History    This chart was scribed for Yolanda Dinino Smitty Cords, MD scribed by Yolanda Carr. The patient was seen in room MH02/MH02 at  23:06   CSN: [id number]  Arrival date & time 07/05/12  2025    Chief Complaint  Patient presents with  . Urinary Tract Infection    (Consider location/radiation/quality/duration/timing/severity/associated sxs/prior treatment) Patient is a 52 y.o. female presenting with urinary tract infection. The history is provided by the patient. No language interpreter was used.  Urinary Tract Infection This is a recurrent problem. The current episode started more than 2 days ago. The problem occurs constantly. The problem has not changed since onset.Nothing aggravates the symptoms. Nothing relieves the symptoms. Treatments tried: left overs of previous antibiotics. The treatment provided mild relief.   Yolanda Carr is a 52 y.o. female who presents to the Emergency Department complaining of constant moderate frequency with associated foul smelling urine odor and bloating with sxs onset 3 days ago.   The patient states that after her periods she has been having UTIs since going through menopause. She says the last time in the ED she was given what she thinks is Cipro. She says she had some left over pills and that she took two three days ago and one yesterday. She notes that she can feel UTI sxs onset and she explains she noticed foul smelling urine odor similar to previous UTI episode.  She denies any vaginal discharge.  OBGYN: Dr. Lynnea Maizes in [place] Past Medical History  Diagnosis Date  . History of recurrent UTIs     Past Surgical History  Procedure Laterality Date  . Cesarean section  1996    History reviewed. No pertinent family history.  History  Substance Use Topics  . Smoking status: Never Smoker   . Smokeless tobacco: Never Used  . Alcohol Use: 1.2 oz/week    2 Cans of beer per week   Review of Systems  Genitourinary: Positive for frequency.        Foul smelling urine odor  All other systems reviewed and are negative.    Allergies  Sulfa antibiotics  Home Medications   Current Outpatient Rx  Name  Route  Sig  Dispense  Refill  . ciprofloxacin (CIPRO) 250 MG tablet   Oral   Take 1 tablet (250 mg total) by mouth every 12 (twelve) hours.   14 tablet   0   . Methenamine-Sodium Salicylate (CYSTEX PO)   Oral   Take 2 tablets by mouth once as needed. For UTI symptoms           . nitrofurantoin, macrocrystal-monohydrate, (MACROBID) 100 MG capsule   Oral   Take 1 capsule (100 mg total) by mouth 2 (two) times daily.   10 capsule   0   . tamoxifen (NOLVADEX) 10 MG tablet   Oral   Take 10 mg by mouth 2 (two) times daily.             BP 127/81  Pulse 102  Temp(Src) 98 F (36.7 C) (Oral)  Resp 20  Ht 5\' 4"  (1.626 m)  Wt 165 lb (74.844 kg)  BMI 28.31 kg/m2  SpO2 100%  LMP 07/04/2012  Physical Exam  Nursing note and vitals reviewed. Constitutional: She is oriented to person, place, and time. She appears well-developed and well-nourished. No distress.  HENT:  Head: Normocephalic and atraumatic.  Mouth/Throat: Oropharynx is clear and moist.  Eyes: Conjunctivae and EOM are normal. Pupils are equal, round, and reactive to light.  Neck: Neck supple. No tracheal deviation present.  Cardiovascular: Normal rate, regular rhythm and normal heart sounds.   No murmur heard. Pulmonary/Chest: Effort normal and breath sounds normal. No respiratory distress. She has no wheezes. She has no rales.  Abdominal: Soft. Bowel sounds are normal. She exhibits no distension and no mass. There is no tenderness. There is no rebound and no guarding.  Musculoskeletal: Normal range of motion.  Neurological: She is alert and oriented to person, place, and time. No sensory deficit.  Skin: Skin is warm and dry.  Psychiatric: She has a normal mood and affect. Her behavior is normal.    ED Course  Procedures (including critical care  time) DIAGNOSTIC STUDIES: Oxygen Saturation is 100% on room air, normal by my interpretation.    COORDINATION OF CARE: 23:07: Physical exam performed. Labs Reviewed  URINALYSIS, ROUTINE W REFLEX MICROSCOPIC  PREGNANCY, URINE   No results found.   No diagnosis found.    MDM   I personally performed the services described in this documentation, which was scribed in my presence. The recorded information has been reviewed and is accurate.          Jasmine Awe, MD 07/06/12 [phone]

## 2012-07-08 ENCOUNTER — Telehealth (HOSPITAL_COMMUNITY): Payer: Self-pay | Admitting: Emergency Medicine

## 2012-07-08 NOTE — ED Notes (Signed)
Pharmacy calling for prescription instruction clarification for Cipro. Dr Oletta Lamas consulted Rx for Cipro should be "1 by mouth twice daily x 7 days # 14".  Information give to RPh.

## 2014-10-23 ENCOUNTER — Encounter (HOSPITAL_BASED_OUTPATIENT_CLINIC_OR_DEPARTMENT_OTHER): Payer: Self-pay | Admitting: Emergency Medicine

## 2014-10-23 ENCOUNTER — Emergency Department (HOSPITAL_BASED_OUTPATIENT_CLINIC_OR_DEPARTMENT_OTHER)
Admission: EM | Admit: 2014-10-23 | Discharge: 2014-10-23 | Disposition: A | Discharge status: Home / Self Care | Attending: Emergency Medicine | Admitting: Emergency Medicine

## 2014-10-23 ENCOUNTER — Emergency Department (HOSPITAL_BASED_OUTPATIENT_CLINIC_OR_DEPARTMENT_OTHER)

## 2014-10-23 DIAGNOSIS — Z9889 Other specified postprocedural states: Secondary | ICD-10-CM | POA: Diagnosis not present

## 2014-10-23 DIAGNOSIS — R079 Chest pain, unspecified: Secondary | ICD-10-CM | POA: Diagnosis not present

## 2014-10-23 DIAGNOSIS — M549 Dorsalgia, unspecified: Secondary | ICD-10-CM | POA: Diagnosis not present

## 2014-10-23 DIAGNOSIS — K5900 Constipation, unspecified: Secondary | ICD-10-CM

## 2014-10-23 DIAGNOSIS — R110 Nausea: Secondary | ICD-10-CM | POA: Diagnosis not present

## 2014-10-23 DIAGNOSIS — Z792 Long term (current) use of antibiotics: Secondary | ICD-10-CM | POA: Diagnosis not present

## 2014-10-23 DIAGNOSIS — Z3202 Encounter for pregnancy test, result negative: Secondary | ICD-10-CM | POA: Insufficient documentation

## 2014-10-23 DIAGNOSIS — Z87440 Personal history of urinary (tract) infections: Secondary | ICD-10-CM | POA: Diagnosis not present

## 2014-10-23 DIAGNOSIS — R109 Unspecified abdominal pain: Secondary | ICD-10-CM | POA: Diagnosis present

## 2014-10-23 DIAGNOSIS — Z79899 Other long term (current) drug therapy: Secondary | ICD-10-CM | POA: Diagnosis not present

## 2014-10-23 LAB — CBC WITH DIFFERENTIAL/PLATELET
BASOS ABS: 0 10*3/uL (ref 0.0–0.1)
BASOS PCT: 0 % (ref 0–1)
EOS PCT: 3 % (ref 0–5)
Eosinophils Absolute: 0.3 10*3/uL (ref 0.0–0.7)
HCT: 40.5 % (ref 36.0–46.0)
Hemoglobin: 13.6 g/dL (ref 12.0–15.0)
LYMPHS PCT: 39 % (ref 12–46)
Lymphs Abs: 3.6 10*3/uL (ref 0.7–4.0)
MCH: 31.7 pg (ref 26.0–34.0)
MCHC: 33.6 g/dL (ref 30.0–36.0)
MCV: 94.4 fL (ref 78.0–100.0)
MONO ABS: 0.9 10*3/uL (ref 0.1–1.0)
Monocytes Relative: 10 % (ref 3–12)
NEUTROS PCT: 48 % (ref 43–77)
Neutro Abs: 4.3 10*3/uL (ref 1.7–7.7)
PLATELETS: 222 10*3/uL (ref 150–400)
RBC: 4.29 MIL/uL (ref 3.87–5.11)
RDW: 14.1 % (ref 11.5–15.5)
WBC: 9.2 10*3/uL (ref 4.0–10.5)

## 2014-10-23 LAB — URINALYSIS, ROUTINE W REFLEX MICROSCOPIC
BILIRUBIN URINE: NEGATIVE
Glucose, UA: NEGATIVE mg/dL
HGB URINE DIPSTICK: NEGATIVE
KETONES UR: NEGATIVE mg/dL
Leukocytes, UA: NEGATIVE
NITRITE: NEGATIVE
PROTEIN: NEGATIVE mg/dL
SPECIFIC GRAVITY, URINE: 1.019 (ref 1.005–1.030)
UROBILINOGEN UA: 1 mg/dL (ref 0.0–1.0)
pH: 6.5 (ref 5.0–8.0)

## 2014-10-23 LAB — COMPREHENSIVE METABOLIC PANEL
ALBUMIN: 3.7 g/dL (ref 3.5–5.0)
ALK PHOS: 86 U/L (ref 38–126)
ALT: 32 U/L (ref 14–54)
AST: 39 U/L (ref 15–41)
Anion gap: 7 (ref 5–15)
BUN: 13 mg/dL (ref 6–20)
CO2: 28 mmol/L (ref 22–32)
Calcium: 9.3 mg/dL (ref 8.9–10.3)
Chloride: 104 mmol/L (ref 101–111)
Creatinine, Ser: 0.76 mg/dL (ref 0.44–1.00)
GFR calc Af Amer: >60 mL/min (ref >60)
GFR calc non Af Amer: >60 mL/min (ref >60)
Glucose, Bld: 251 mg/dL — ABNORMAL HIGH (ref 65–99)
POTASSIUM: 4.1 mmol/L (ref 3.5–5.1)
SODIUM: 139 mmol/L (ref 135–145)
TOTAL PROTEIN: 7.2 g/dL (ref 6.5–8.1)
Total Bilirubin: 0.5 mg/dL (ref 0.3–1.2)

## 2014-10-23 LAB — PREGNANCY, URINE: PREG TEST UR: NEGATIVE

## 2014-10-23 MED ORDER — POLYETHYLENE GLYCOL 3350 17 G PO PACK: 17.0000 g | PACK | Freq: Every day | ORAL | Status: AC

## 2014-10-23 MED ORDER — DICYCLOMINE HCL 20 MG PO TABS: 20.0000 mg | ORAL_TABLET | Freq: Two times a day (BID) | ORAL | Status: DC | PRN

## 2014-10-23 NOTE — ED Notes (Signed)
Dr. Yelverton at [facility] 

## 2014-10-23 NOTE — ED Provider Notes (Signed)
CSN: [id number]     Arrival date & time 10/23/14  0218 History   First MD Initiated Contact with Patient 10/23/14 0235     Chief Complaint  Patient presents with  . Flank Pain     (Consider location/radiation/quality/duration/timing/severity/associated sxs/prior Treatment) HPI Patient presents with 2 weeks www.distention especially after eating. She also had some nausea. The last week she's been having sharp left-sided flank pain. The pain to last about 15 seconds and then remits completely. This evening she states the pain was coming frequently. No radiation of the pain. Denies any dysuria, frequency, hematuria or urgency. No fever or chills. States she has regular daily bowel movements. Currently is pain-free.  Past Medical History  Diagnosis Date  . History of recurrent UTIs    Past Surgical History  Procedure Laterality Date  . Cesarean section  1996   History reviewed. No pertinent family history. History  Substance Use Topics  . Smoking status: Never Smoker   . Smokeless tobacco: Never Used  . Alcohol Use: 1.2 oz/week    2 Cans of beer per week   OB History    No data available     Review of Systems  Constitutional: Negative for fever and chills.  Respiratory: Negative for shortness of breath.   Cardiovascular: Positive for chest pain.  Gastrointestinal: Positive for nausea and abdominal distention. Negative for vomiting, diarrhea and constipation.  Genitourinary: Positive for flank pain. Negative for dysuria, frequency, hematuria and pelvic pain.  Musculoskeletal: Positive for back pain. Negative for myalgias, neck pain and neck stiffness.  Skin: Negative for rash and wound.  Neurological: Negative for weakness, light-headedness, numbness and headaches.  All other systems reviewed and are negative.     Allergies  Estrogens and Sulfa antibiotics  Home Medications   Prior to Admission medications   Medication Sig Start Date End Date Taking? Authorizing Provider   ciprofloxacin (CIPRO) 250 MG tablet Take 1 tablet (250 mg total) by mouth every 12 (twelve) hours. 05/03/12   Loren Racer, MD  ciprofloxacin (CIPRO) 500 MG tablet Take 1 tablet (500 mg total) by mouth 2 (two) times daily. One po bid x 7 days 07/05/12   April Palumbo, MD  dicyclomine (BENTYL) 20 MG tablet Take 1 tablet (20 mg total) by mouth 2 (two) times daily as needed for spasms. 10/23/14   Loren Racer, MD  Methenamine-Sodium Salicylate (CYSTEX PO) Take 2 tablets by mouth once as needed. For UTI symptoms      Historical Provider, MD  nitrofurantoin, macrocrystal-monohydrate, (MACROBID) 100 MG capsule Take 1 capsule (100 mg total) by mouth 2 (two) times daily. 04/22/12   Lowell Bouton, PA-C  polyethylene glycol (MIRALAX / GLYCOLAX) packet Take 17 g by mouth daily. 10/23/14   Loren Racer, MD  tamoxifen (NOLVADEX) 10 MG tablet Take 10 mg by mouth 2 (two) times daily.      Historical Provider, MD   BP 124/71 mmHg  Pulse 69  Temp(Src) 98.4 F (36.9 C) (Oral)  Resp 18  Ht  (1.626 m)  Wt 170 lb (77.111 kg)  BMI 29.17 kg/m2  SpO2 99%  LMP 07/04/2012 Physical Exam  Constitutional: She is oriented to person, place, and time. She appears well-developed and well-nourished. No distress.  HENT:  Head: Normocephalic and atraumatic.  Mouth/Throat: Oropharynx is clear and moist.  Eyes: EOM are normal. Pupils are equal, round, and reactive to light.  Neck: Normal range of motion. Neck supple.  Cardiovascular: Normal rate and regular rhythm.   Pulmonary/Chest:  Effort normal and breath sounds normal. No respiratory distress. She has no wheezes. She has no rales.  Abdominal: Soft. Bowel sounds are normal. She exhibits no distension. There is no tenderness. There is no rebound and no guarding.  Musculoskeletal: Normal range of motion. She exhibits no edema or tenderness.  No CVA tenderness bilaterally. No paraspinal or midline thoracic or lumbar tenderness.  Neurological: She is alert and  oriented to person, place, and time.  Moves all extremities without deficit. Sensation is intact. Ambulating without difficulty.  Skin: Skin is warm and dry. No rash noted. No erythema.  Psychiatric: She has a normal mood and affect. Her behavior is normal.  Nursing note and vitals reviewed.   ED Course  Procedures (including critical care time) Labs Review Labs Reviewed  URINALYSIS, ROUTINE W REFLEX [facility]CROSCOPIC [facility]) - Abnormal; Notable for the following:    APPearance CLOUDY (*)    All other components within normal limits  COMPREHENSIVE METABOLIC PANEL - Abnormal; Notable for the following:    Glucose, Bld 251 (*)    All other components within normal limits  PREGNANCY, URINE  CBC WITH DIFFERENTIAL/PLATELET    Imaging Review Dg Abd 1 View  10/23/2014   CLINICAL DATA:  Subacute onset of nausea and bloating. Twinges of pain at the left flank. Initial encounter.  EXAM: ABDOMEN - 1 VIEW  COMPARISON:  None.  FINDINGS: The visualized bowel gas pattern is unremarkable. Scattered air and stool filled loops of colon are seen; no abnormal dilatation of small bowel loops is seen to suggest small bowel obstruction. No free intra-abdominal air is identified, though evaluation for free air is limited on a single supine view.  The visualized osseous structures are within normal limits; the sacroiliac joints are unremarkable in appearance. The visualized lung bases are essentially clear.  IMPRESSION: Unremarkable bowel gas pattern; no free intra-abdominal air seen. Small to moderate amount of stool noted in the colon.   Electronically Signed   By: Roanna RaiderJeffery  Chang M.D.   On: 10/23/2014 03:01     EKG Interpretation None      MDM   Final diagnoses:  Flank pain  Constipation, unspecified constipation type   Patient remains asymptomatic. Vital signs normal. Normal laboratory exam. Normal white blood cell count. UA is normal. Moderate amount of stool in colon. Given spasm-like quality of the  patient's pain suspect constipation. Will start on MiraLAX and Bentyl. Patient is advised follow-up with primary physician. Return precautions given.     Loren Raceravid Jden Want, MD 10/23/14 [phone]

## 2014-10-23 NOTE — ED Notes (Addendum)
C/o L flank pain, also nausea and abd bloating. (denies: abd pain, nvd, constipation, fever, urinary sx or changes, bleeding, fever, itching or other sx). States, "think it is a kidney stone". Family present.

## 2014-10-23 NOTE — ED Notes (Signed)
Patient states that she has had Nausea and bloating for the last 2 weeks. The patient reports that she has had "twinges of pain" to her left flank x 1 week. Tonight they are stronger in intensity.

## 2014-10-23 NOTE — ED Notes (Signed)
Pt ambulatory to room with steady gait with family present. Pt alert, NAD, calm, interactive, steady gait.

## 2014-10-23 NOTE — ED Notes (Signed)
Pt to xray. Pt seen by EDP prior to RN assessment, see MD notes, orders received and initiated.

## 2014-10-23 NOTE — Discharge Instructions (Signed)

## 2015-02-24 ENCOUNTER — Encounter (HOSPITAL_BASED_OUTPATIENT_CLINIC_OR_DEPARTMENT_OTHER): Payer: Self-pay

## 2015-02-24 ENCOUNTER — Emergency Department (HOSPITAL_BASED_OUTPATIENT_CLINIC_OR_DEPARTMENT_OTHER)
Admission: EM | Admit: 2015-02-24 | Discharge: 2015-02-24 | Disposition: A | Discharge status: Home / Self Care | Attending: Emergency Medicine | Admitting: Emergency Medicine

## 2015-02-24 DIAGNOSIS — N390 Urinary tract infection, site not specified: Secondary | ICD-10-CM | POA: Diagnosis not present

## 2015-02-24 DIAGNOSIS — Z79899 Other long term (current) drug therapy: Secondary | ICD-10-CM | POA: Insufficient documentation

## 2015-02-24 DIAGNOSIS — R300 Dysuria: Secondary | ICD-10-CM | POA: Diagnosis present

## 2015-02-24 LAB — URINALYSIS, ROUTINE W REFLEX MICROSCOPIC
BILIRUBIN URINE: NEGATIVE
Glucose, UA: NEGATIVE mg/dL
KETONES UR: NEGATIVE mg/dL
NITRITE: POSITIVE — AB
PROTEIN: NEGATIVE mg/dL
SPECIFIC GRAVITY, URINE: 1.023 (ref 1.005–1.030)
UROBILINOGEN UA: 1 mg/dL (ref 0.0–1.0)
pH: 5.5 (ref 5.0–8.0)

## 2015-02-24 LAB — URINE MICROSCOPIC-ADD ON

## 2015-02-24 MED ORDER — CIPROFLOXACIN HCL 500 MG PO TABS: 500.0000 mg | ORAL_TABLET | Freq: Two times a day (BID) | ORAL | Status: DC

## 2015-02-24 MED ORDER — FLUCONAZOLE 100 MG PO TABS: 100.0000 mg | ORAL_TABLET | Freq: Every day | ORAL | Status: AC

## 2015-02-24 NOTE — ED Provider Notes (Signed)
CSN: [id number]     Arrival date & time 02/24/15  1319 History   First MD Initiated Contact with Patient 02/24/15 1328     Chief Complaint  Patient presents with  . Dysuria     (Consider location/radiation/quality/duration/timing/severity/associated sxs/prior Treatment) HPI Comments: Pt comes in with c/o burning and frequency with urination. No fever, back pain or vaginal discharge. History of similar with uti about 8 months ago. Denies abdominal pain.  The history is provided by the patient. No language interpreter was used.    Past Medical History  Diagnosis Date  . History of recurrent UTIs    Past Surgical History  Procedure Laterality Date  . Cesarean section  1996   No family history on file. Social History  Substance Use Topics  . Smoking status: Never Smoker   . Smokeless tobacco: Never Used  . Alcohol Use: 1.2 oz/week    2 Cans of beer per week   OB History    No data available     Review of Systems  All other systems reviewed and are negative.     Allergies  Estrogens and Sulfa antibiotics  Home Medications   Prior to Admission medications   Medication Sig Start Date End Date Taking? Authorizing Provider  ciprofloxacin (CIPRO) 250 MG tablet Take 1 tablet (250 mg total) by mouth every 12 (twelve) hours. 05/03/12   Loren Raceravid Yelverton, MD  ciprofloxacin (CIPRO) 500 MG tablet Take 1 tablet (500 mg total) by mouth 2 (two) times daily. One po bid x 7 days 07/05/12   April Palumbo, MD  dicyclomine (BENTYL) 20 MG tablet Take 1 tablet (20 mg total) by mouth 2 (two) times daily as needed for spasms. 10/23/14   Loren Raceravid Yelverton, MD  Methenamine-Sodium Salicylate (CYSTEX PO) Take 2 tablets by mouth once as needed. For UTI symptoms      Historical Provider, MD  nitrofurantoin, macrocrystal-monohydrate, (MACROBID) 100 MG capsule Take 1 capsule (100 mg total) by mouth 2 (two) times daily. 04/22/12   Lowell BoutonBarbara A Beck, PA-C  polyethylene glycol (MIRALAX / GLYCOLAX) packet Take 17  g by mouth daily. 10/23/14   Loren Raceravid Yelverton, MD  tamoxifen (NOLVADEX) 10 MG tablet Take 10 mg by mouth 2 (two) times daily.      Historical Provider, MD   LMP 07/04/2012 Physical Exam  Constitutional: She is oriented to person, place, and time. She appears well-developed and well-nourished.  Cardiovascular: Normal rate and regular rhythm.   Pulmonary/Chest: Effort normal and breath sounds normal.  Abdominal: Soft. Bowel sounds are normal.  Musculoskeletal: Normal range of motion.  Neurological: She is alert and oriented to person, place, and time.  Skin: Skin is warm and dry.  Psychiatric: She has a normal mood and affect.  Nursing note and vitals reviewed.   ED Course  Procedures (including critical care time) Labs Review Labs Reviewed  URINALYSIS, ROUTINE W REFLEX MICROSCOPIC (NOT [facility]) - Abnormal; Notable for the following:    APPearance CLOUDY (*)    Hgb urine dipstick MODERATE (*)    Nitrite POSITIVE (*)    Leukocytes, UA MODERATE (*)    All other components within normal limits  URINE MICROSCOPIC-ADD ON - Abnormal; Notable for the following:    Bacteria, UA MANY (*)    All other components within normal limits  URINE CULTURE    Imaging Review No results found. I have personally reviewed and evaluated these images and lab results as part of my medical decision-making.   EKG Interpretation None  MDM   Final diagnoses:  UTI (lower urinary tract infection)   Treated for simple uti with cipro. Urine culture sent. Pt allergic to sulfa and states that cipro seems to work better for her then [employer], NP 02/24/15 2210  Gwyneth Sprout, MD 02/25/15 [phone]

## 2015-02-24 NOTE — ED Notes (Signed)
Vrinda, NP at bedside.  

## 2015-02-24 NOTE — ED Notes (Signed)
Reports burning with urination and frequency that started last night.

## 2015-02-24 NOTE — Discharge Instructions (Signed)

## 2015-02-26 LAB — URINE CULTURE: Culture: >=100000

## 2015-02-27 ENCOUNTER — Telehealth (HOSPITAL_BASED_OUTPATIENT_CLINIC_OR_DEPARTMENT_OTHER): Payer: Self-pay | Admitting: Emergency Medicine

## 2015-02-27 NOTE — Telephone Encounter (Signed)
Post ED Visit - Positive Culture Follow-up  Culture report reviewed by antimicrobial stewardship pharmacist:  []  Enzo BiNathan Batchelder, Pharm.D. []  Celedonio MiyamotoJeremy Frens, Pharm.D., BCPS []  Garvin FilaMike Maccia, Pharm.D. []  Georgina PillionElizabeth Martin, Pharm.D., BCPS [x]  FlanaganMinh Pham, [address]Pharm[address]D., BCPS, AAHIVP []  Estella HuskMichelle Turner, Pharm.D., BCPS, AAHIVP []  Tennis Mustassie Stewart, Pharm.D. []  Sherle Poeob Vincent, [address]Pharm[address]D.  Positive urine culture E. coli Treated with ciprofloxacin and flucanazole, organism sensitive to the same and no further patient follow-up is required at this time.  Berle MullMiller, Scout Guyett 02/27/2015, 10:00 AM

## 2015-07-20 ENCOUNTER — Emergency Department (HOSPITAL_BASED_OUTPATIENT_CLINIC_OR_DEPARTMENT_OTHER)

## 2015-07-20 ENCOUNTER — Emergency Department (HOSPITAL_BASED_OUTPATIENT_CLINIC_OR_DEPARTMENT_OTHER)
Admission: EM | Admit: 2015-07-20 | Discharge: 2015-07-20 | Disposition: A | Discharge status: Home / Self Care | Attending: Emergency Medicine | Admitting: Emergency Medicine

## 2015-07-20 ENCOUNTER — Encounter (HOSPITAL_BASED_OUTPATIENT_CLINIC_OR_DEPARTMENT_OTHER): Payer: Self-pay

## 2015-07-20 ENCOUNTER — Emergency Department (HOSPITAL_BASED_OUTPATIENT_CLINIC_OR_DEPARTMENT_OTHER)
Admit: 2015-07-20 | Discharge: 2015-07-20 | Disposition: A | Discharge status: Home / Self Care | Attending: Emergency Medicine | Admitting: Emergency Medicine

## 2015-07-20 DIAGNOSIS — R1013 Epigastric pain: Secondary | ICD-10-CM | POA: Diagnosis not present

## 2015-07-20 DIAGNOSIS — Z87440 Personal history of urinary (tract) infections: Secondary | ICD-10-CM | POA: Diagnosis not present

## 2015-07-20 DIAGNOSIS — R1011 Right upper quadrant pain: Secondary | ICD-10-CM

## 2015-07-20 DIAGNOSIS — Z792 Long term (current) use of antibiotics: Secondary | ICD-10-CM | POA: Insufficient documentation

## 2015-07-20 DIAGNOSIS — E663 Overweight: Secondary | ICD-10-CM | POA: Diagnosis not present

## 2015-07-20 DIAGNOSIS — R1010 Upper abdominal pain, unspecified: Secondary | ICD-10-CM | POA: Diagnosis present

## 2015-07-20 DIAGNOSIS — R112 Nausea with vomiting, unspecified: Secondary | ICD-10-CM | POA: Diagnosis not present

## 2015-07-20 DIAGNOSIS — Z8639 Personal history of other endocrine, nutritional and metabolic disease: Secondary | ICD-10-CM | POA: Diagnosis not present

## 2015-07-20 DIAGNOSIS — Z79899 Other long term (current) drug therapy: Secondary | ICD-10-CM | POA: Insufficient documentation

## 2015-07-20 HISTORY — DX: Disorder of thyroid, unspecified: E07.9

## 2015-07-20 LAB — CBC
HEMATOCRIT: 39.4 % (ref 36.0–46.0)
HEMOGLOBIN: 13.8 g/dL (ref 12.0–15.0)
MCH: 32.2 pg (ref 26.0–34.0)
MCHC: 35 g/dL (ref 30.0–36.0)
MCV: 91.8 fL (ref 78.0–100.0)
Platelets: 203 10*3/uL (ref 150–400)
RBC: 4.29 MIL/uL (ref 3.87–5.11)
RDW: 13.4 % (ref 11.5–15.5)
WBC: 9.4 10*3/uL (ref 4.0–10.5)

## 2015-07-20 LAB — URINALYSIS, ROUTINE W REFLEX MICROSCOPIC
BILIRUBIN URINE: NEGATIVE
HGB URINE DIPSTICK: NEGATIVE
Ketones, ur: 15 mg/dL — AB
Leukocytes, UA: NEGATIVE
Nitrite: NEGATIVE
PH: 6 (ref 5.0–8.0)
Protein, ur: NEGATIVE mg/dL
SPECIFIC GRAVITY, URINE: 1.026 (ref 1.005–1.030)

## 2015-07-20 LAB — COMPREHENSIVE METABOLIC PANEL
ALBUMIN: 3.3 g/dL — AB (ref 3.5–5.0)
ALK PHOS: 88 U/L (ref 38–126)
ALT: 39 U/L (ref 14–54)
ANION GAP: 6 (ref 5–15)
AST: 42 U/L — ABNORMAL HIGH (ref 15–41)
BUN: 13 mg/dL (ref 6–20)
CALCIUM: 9.1 mg/dL (ref 8.9–10.3)
CHLORIDE: 101 mmol/L (ref 101–111)
CO2: 28 mmol/L (ref 22–32)
Creatinine, Ser: 0.54 mg/dL (ref 0.44–1.00)
GFR calc Af Amer: >60 mL/min (ref >60)
GFR calc non Af Amer: >60 mL/min (ref >60)
GLUCOSE: 281 mg/dL — AB (ref 65–99)
Potassium: 3.8 mmol/L (ref 3.5–5.1)
SODIUM: 135 mmol/L (ref 135–145)
Total Bilirubin: 0.6 mg/dL (ref 0.3–1.2)
Total Protein: 6.7 g/dL (ref 6.5–8.1)

## 2015-07-20 LAB — LIPASE, BLOOD: LIPASE: 40 U/L (ref 11–51)

## 2015-07-20 LAB — URINE MICROSCOPIC-ADD ON

## 2015-07-20 MED ORDER — ONDANSETRON HCL 4 MG/2ML IJ SOLN
4.0000 mg | Freq: Once | INTRAMUSCULAR | Status: AC
  Administered 2015-07-20: 4 mg via INTRAVENOUS
  Filled 2015-07-20: qty 2

## 2015-07-20 MED ORDER — PANTOPRAZOLE SODIUM 40 MG IV SOLR
40.0000 mg | Freq: Once | INTRAVENOUS | Status: AC
  Administered 2015-07-20: 40 mg via INTRAVENOUS
  Filled 2015-07-20: qty 40

## 2015-07-20 MED ORDER — OMEPRAZOLE 20 MG PO CPDR: 20.0000 mg | DELAYED_RELEASE_CAPSULE | Freq: Every day | ORAL | Status: AC

## 2015-07-20 MED ORDER — GI COCKTAIL ~~LOC~~
30.0000 mL | Freq: Once | ORAL | Status: AC
  Administered 2015-07-20: 30 mL via ORAL
  Filled 2015-07-20: qty 30

## 2015-07-20 MED ORDER — ONDANSETRON 4 MG PO TBDP: 4.0000 mg | ORAL_TABLET | Freq: Three times a day (TID) | ORAL | Status: AC | PRN

## 2015-07-20 NOTE — ED Provider Notes (Signed)
CSN: [id number]     Arrival date & time 07/20/15  0120 History   First MD Initiated Contact with Patient 07/20/15 [phone]     Chief Complaint  Patient presents with  . Abdominal Pain     (Consider location/radiation/quality/duration/timing/severity/associated sxs/prior Treatment) HPI  Patient presents with abdominal pain. Onset of pain over the last week. It appears to wax and wane. Associated with eating. It is upper abdominal pain sometimes in the midline and sometimes to the right and to the left. Patient has associated abdominal bloating. She's had one episode of nonbilious, nonbloody emesis. Patient reports normal bowel movements. Currently she rates her pain 8 out of 10. She's not taken anything for the pain. She denies any fevers or urinary symptoms.  Past Medical History  Diagnosis Date  . History of recurrent UTIs   . Thyroid disease    Past Surgical History  Procedure Laterality Date  . Cesarean section  1996   No family history on file. Social History  Substance Use Topics  . Smoking status: Never Smoker   . Smokeless tobacco: Never Used  . Alcohol Use: 1.2 oz/week    2 Cans of beer per week   OB History    No data available     Review of Systems  Constitutional: Negative for fever.  Respiratory: Negative for shortness of breath.   Cardiovascular: Negative for chest pain.  Gastrointestinal: Positive for nausea, vomiting and abdominal pain. Negative for diarrhea and constipation.  Genitourinary: Negative for dysuria and difficulty urinating.  All other systems reviewed and are negative.     Allergies  Estrogens and Sulfa antibiotics  Home Medications   Prior to Admission medications   Medication Sig Start Date End Date Taking? Authorizing Provider  ciprofloxacin (CIPRO) 250 MG tablet Take 1 tablet (250 mg total) by mouth every 12 (twelve) hours. 05/03/12   Loren Racer, MD  ciprofloxacin (CIPRO) 500 MG tablet Take 1 tablet (500 mg total) by mouth 2 (two)  times daily. One po bid x 7 days 07/05/12   April Palumbo, MD  ciprofloxacin (CIPRO) 500 MG tablet Take 1 tablet (500 mg total) by mouth 2 (two) times daily. 02/24/15   Teressa Lower, NP  dicyclomine (BENTYL) 20 MG tablet Take 1 tablet (20 mg total) by mouth 2 (two) times daily as needed for spasms. 10/23/14   Loren Racer, MD  Methenamine-Sodium Salicylate (CYSTEX PO) Take 2 tablets by mouth once as needed. For UTI symptoms      Historical Provider, MD  nitrofurantoin, macrocrystal-monohydrate, (MACROBID) 100 MG capsule Take 1 capsule (100 mg total) by mouth 2 (two) times daily. 04/22/12   Lowell Bouton, PA-C  omeprazole (PRILOSEC) 20 MG capsule Take 1 capsule (20 mg total) by mouth daily. 07/20/15   Shon Baton, MD  ondansetron (ZOFRAN ODT) 4 MG disintegrating tablet Take 1 tablet (4 mg total) by mouth every 8 (eight) hours as needed for nausea or vomiting. 07/20/15   Shon Baton, MD  polyethylene glycol (MIRALAX / GLYCOLAX) packet Take 17 g by mouth daily. 10/23/14   Loren Racer, MD  tamoxifen (NOLVADEX) 10 MG tablet Take 10 mg by mouth 2 (two) times daily.      Historical Provider, MD   BP 141/86 mmHg  Pulse 96  Temp(Src) 98.1 F (36.7 C) (Oral)  Resp 18  Ht  (1.626 m)  Wt 165 lb (74.844 kg)  BMI 28.31 kg/m2  SpO2 98%  LMP 07/04/2012 Physical Exam  Constitutional: She is oriented  to person, place, and time. She appears well-developed and well-nourished.  Overweight, no acute distress  HENT:  Head: Normocephalic and atraumatic.  Cardiovascular: Normal rate, regular rhythm and normal heart sounds.   Pulmonary/Chest: Effort normal and breath sounds normal. No respiratory distress. She has no wheezes.  Abdominal: Soft. Bowel sounds are normal. There is tenderness. There is no rebound and no guarding.  Minimal epigastric tenderness to palpation without rebound or guarding  Neurological: She is alert and oriented to person, place, and time.  Skin: Skin is warm and dry.   Psychiatric: She has a normal mood and affect.  Nursing note and vitals reviewed.   ED Course  Procedures (including critical care time) Labs Review Labs Reviewed  COMPREHENSIVE METABOLIC PANEL - Abnormal; Notable for the following:    Glucose, Bld 281 (*)    Albumin 3.3 (*)    AST 42 (*)    All other components within normal limits  URINALYSIS, ROUTINE W REFLEX MICROSCOPIC (NOT AT [facility]) - Abnormal; Notable for the following:    Glucose, UA >1000 (*)    Ketones, ur 15 (*)    All other components within normal limits  URINE MICROSCOPIC-ADD ON - Abnormal; Notable for the following:    Squamous Epithelial / LPF 0-5 (*)    Bacteria, UA FEW (*)    All other components within normal limits  LIPASE, BLOOD  CBC    Imaging Review Dg Abd Acute W/chest  07/20/2015  CLINICAL DATA:  Epigastric pain with nausea, intermittent all week but worsened tonight. EXAM: DG ABDOMEN ACUTE W/ 1V CHEST COMPARISON:  10/23/2014 FINDINGS: There is no evidence of dilated bowel loops or free intraperitoneal air. No radiopaque calculi or other significant radiographic abnormality is seen. Heart size and mediastinal contours are within normal limits. Both lungs are clear. IMPRESSION: Negative abdominal radiographs.  No acute cardiopulmonary disease. Electronically Signed   By: Ellery Plunkaniel R Mitchell M.D.   On: 07/20/2015 03:09   I have personally reviewed and evaluated these images and lab results as part of my medical decision-making.   EKG Interpretation None      MDM   Final diagnoses:  Epigastric pain    Patient presents with upper abdominal pain. Associated with food. Nontoxic on exam. Minimal tenderness. Considerations include gallbladder pathology, gastritis, peptic ulcer disease. Basic labwork is reassuring. Patient was given Zofran, Protonix, and a GI cocktail. She reports improvement of symptoms after this. There is no evidence of obstruction on KUB. Discussed the results with the patient.  Will  schedule patient for ultrasound later today.  After history, exam, and medical workup I feel the patient has been appropriately medically screened and is safe for discharge home. Pertinent diagnoses were discussed with the patient. Patient was given return precautions.     Shon Batonourtney F Estephani Popper, MD 07/20/15 [phone]

## 2015-07-20 NOTE — ED Notes (Signed)
Tolerated fluids well.

## 2015-07-20 NOTE — Discharge Instructions (Signed)
You're seen today for abdominal pain. The cause of your pain at this time is unclear however your workup is reassuring. You will be started on a acid reducer.  Return for ultrasound later today.  Abdominal Pain, Adult Many things can cause abdominal pain. Usually, abdominal pain is not caused by a disease and will improve without treatment. It can often be observed and treated at home. Your health care provider will do a physical exam and possibly order blood tests and X-rays to help determine the seriousness of your pain. However, in many cases, more time must pass before a clear cause of the pain can be found. Before that point, your health care provider may not know if you need more testing or further treatment. HOME CARE INSTRUCTIONS Monitor your abdominal pain for any changes. The following actions may help to alleviate any discomfort you are experiencing:  Only take over-the-counter or prescription medicines as directed by your health care provider.  Do not take laxatives unless directed to do so by your health care provider.  Try a clear liquid diet (broth, tea, or water) as directed by your health care provider. Slowly move to a bland diet as tolerated. SEEK MEDICAL CARE IF:  You have unexplained abdominal pain.  You have abdominal pain associated with nausea or diarrhea.  You have pain when you urinate or have a bowel movement.  You experience abdominal pain that wakes you in the night.  You have abdominal pain that is worsened or improved by eating food.  You have abdominal pain that is worsened with eating fatty foods.  You have a fever. SEEK IMMEDIATE MEDICAL CARE IF:  Your pain does not go away within 2 hours.  You keep throwing up (vomiting).  Your pain is felt only in portions of the abdomen, such as the right side or the left lower portion of the abdomen.  You pass bloody or black tarry stools. MAKE SURE YOU:  Understand these instructions.  Will watch your  condition.  Will get help right away if you are not doing well or get worse.   This information is not intended to replace advice given to you by your health care provider. Make sure you discuss any questions you have with your health care provider.   Document Released: 01/10/2005 Document Revised: 12/22/2014 Document Reviewed: 12/10/2012 Elsevier Interactive Patient Education [employer]2016 Elsevier Inc.

## 2015-07-20 NOTE — ED Notes (Signed)
Return from xray dept.

## 2015-07-20 NOTE — ED Notes (Signed)
Pt c/o center abdominal pain with nausea tonight; states pain has been off and on all week, some on left, but all in center tonight.

## 2015-11-02 ENCOUNTER — Emergency Department (HOSPITAL_BASED_OUTPATIENT_CLINIC_OR_DEPARTMENT_OTHER)
Admission: EM | Admit: 2015-11-02 | Discharge: 2015-11-02 | Disposition: A | Discharge status: Home / Self Care | Attending: Emergency Medicine | Admitting: Emergency Medicine

## 2015-11-02 ENCOUNTER — Encounter (HOSPITAL_BASED_OUTPATIENT_CLINIC_OR_DEPARTMENT_OTHER): Payer: Self-pay | Admitting: Emergency Medicine

## 2015-11-02 ENCOUNTER — Telehealth (HOSPITAL_BASED_OUTPATIENT_CLINIC_OR_DEPARTMENT_OTHER): Payer: Self-pay | Admitting: *Deleted

## 2015-11-02 DIAGNOSIS — N390 Urinary tract infection, site not specified: Secondary | ICD-10-CM | POA: Diagnosis not present

## 2015-11-02 DIAGNOSIS — R350 Frequency of micturition: Secondary | ICD-10-CM | POA: Diagnosis present

## 2015-11-02 NOTE — ED Notes (Signed)
Pt charted on paper forms during downtime. 

## 2015-11-02 NOTE — ED Provider Notes (Signed)
CSN: [id number]     Arrival date & time 11/02/15  [id number] History   None    No chief complaint on file.    (Consider location/radiation/quality/duration/timing/severity/associated sxs/prior Treatment) Patient is a 55 y.o. female presenting with frequency. The history is provided by the patient.  Urinary Frequency This is a recurrent problem. The current episode started 12 to 24 hours ago. The problem occurs constantly. The problem has not changed since onset.Pertinent negatives include no chest pain, no abdominal pain, no headaches and no shortness of breath. Nothing aggravates the symptoms. Nothing relieves the symptoms. She has tried nothing for the symptoms. The treatment provided no relief.    History reviewed. No pertinent past medical history. No past surgical history on file. History reviewed. No pertinent family history. Social History  Substance Use Topics  . Smoking status: None  . Smokeless tobacco: None  . Alcohol Use: None   OB History    No data available     Review of Systems  Constitutional: Negative for fever.  Respiratory: Negative for shortness of breath.   Cardiovascular: Negative for chest pain.  Gastrointestinal: Negative for abdominal pain.  Genitourinary: Positive for urgency and frequency. Negative for flank pain.  Neurological: Negative for headaches.  All other systems reviewed and are negative.     Allergies  Review of patient's allergies indicates not on file.  Home Medications   Prior to Admission medications   Not on File   There were no vitals taken for this visit. Physical Exam  Constitutional: She is oriented to person, place, and time. She appears well-developed and well-nourished. No distress.  HENT:  Head: Normocephalic and atraumatic.  Mouth/Throat: Oropharynx is clear and moist.  Eyes: Conjunctivae are normal. Pupils are equal, round, and reactive to light.  Neck: Normal range of motion. Neck supple.  Cardiovascular: Normal  rate, regular rhythm and intact distal pulses.   Pulmonary/Chest: Effort normal and breath sounds normal. She has no wheezes. She has no rales.  Abdominal: Soft. Bowel sounds are normal. There is no tenderness. There is no rebound and no guarding.  Musculoskeletal: Normal range of motion.  Neurological: She is alert and oriented to person, place, and time.  Skin: Skin is dry.  Psychiatric: She has a normal mood and affect.    ED Course  Procedures (including critical care time) Labs Review Labs Reviewed - No data to display  Imaging Review No results found. I have personally reviewed and evaluated these images and lab results as part of my medical decision-making.   EKG Interpretation None      MDM   Final diagnoses:  None   See down time forms for urinalysis and vitals  Given cipro in the Ed  RX for cipro and diflucan as patient states this is what works for her. All questions answered to patient's satisfaction. Based on history and exam patient has been appropriately medically screened and emergency conditions excluded. Patient is stable for discharge at this time. Follow up provided and strict return precautions given    Celese Banner, MD 11/02/15 [phone]

## 2015-11-03 ENCOUNTER — Encounter (HOSPITAL_BASED_OUTPATIENT_CLINIC_OR_DEPARTMENT_OTHER): Payer: Self-pay

## 2016-02-17 ENCOUNTER — Encounter (HOSPITAL_BASED_OUTPATIENT_CLINIC_OR_DEPARTMENT_OTHER): Payer: Self-pay | Admitting: *Deleted

## 2016-02-17 ENCOUNTER — Emergency Department (HOSPITAL_BASED_OUTPATIENT_CLINIC_OR_DEPARTMENT_OTHER)
Admission: EM | Admit: 2016-02-17 | Discharge: 2016-02-17 | Disposition: A | Discharge status: Home / Self Care | Attending: Emergency Medicine | Admitting: Emergency Medicine

## 2016-02-17 DIAGNOSIS — F17200 Nicotine dependence, unspecified, uncomplicated: Secondary | ICD-10-CM | POA: Diagnosis not present

## 2016-02-17 DIAGNOSIS — R300 Dysuria: Secondary | ICD-10-CM | POA: Diagnosis present

## 2016-02-17 DIAGNOSIS — N390 Urinary tract infection, site not specified: Secondary | ICD-10-CM | POA: Diagnosis not present

## 2016-02-17 DIAGNOSIS — R739 Hyperglycemia, unspecified: Secondary | ICD-10-CM

## 2016-02-17 LAB — URINE MICROSCOPIC-ADD ON

## 2016-02-17 LAB — URINALYSIS, ROUTINE W REFLEX MICROSCOPIC
BILIRUBIN URINE: NEGATIVE
Ketones, ur: NEGATIVE mg/dL
Nitrite: NEGATIVE
PROTEIN: NEGATIVE mg/dL
SPECIFIC GRAVITY, URINE: 1.021 (ref 1.005–1.030)
pH: 7.5 (ref 5.0–8.0)

## 2016-02-17 LAB — CBG MONITORING, ED: Glucose-Capillary: 287 mg/dL — ABNORMAL HIGH (ref 65–99)

## 2016-02-17 MED ORDER — CIPROFLOXACIN HCL 500 MG PO TABS: 500.0000 mg | ORAL_TABLET | Freq: Two times a day (BID) | ORAL | 0 refills | Status: DC

## 2016-02-17 MED ORDER — FLUCONAZOLE 150 MG PO TABS: 150.0000 mg | ORAL_TABLET | Freq: Once | ORAL | 0 refills | Status: AC

## 2016-02-17 MED FILL — FLUCONAZOLE 150 MG TABLET: 150 | 1 days supply | Qty: 1 | Fill #0

## 2016-02-17 MED FILL — CIPROFLOXACIN HCL 500 MG TA: 500 | 7 days supply | Qty: 14 | Fill #0

## 2016-02-17 NOTE — ED Provider Notes (Signed)
[facility] Note   CSN: [id number] Arrival date & time: 02/17/16  1107     History   Chief Complaint Chief Complaint  Patient presents with  . Dysuria    HPI Jorge MandrilCynthia Kopecky is a 55 y.o. female.  HPI   55 year old female with history of recurrent UTIs, thyroid disease presents with complaints of urinary urgency. Patient states since she turns 50 she has had recurrent urinary tract infection at least once a year which her doctor felt was related to her menopause. Patient states ciprofloxacin usually helps, and she normally always develop a yeast infection and would require Diflucan. Yesterday she noticed urinary frequency and urgency that felt very similar to prior UTI. She denies any associated fever, chills, abdominal pain, burning urination, vaginal bleeding or vaginal discharge. No recent sexual partner change. No specific treatment tried. No other complaint.  Past Medical History:  Diagnosis Date  . History of recurrent UTIs   . Thyroid disease     There are no active problems to display for this patient.   Past Surgical History:  Procedure Laterality Date  . CESAREAN SECTION  1996    OB History    Gravida Para Term Preterm AB Living   0 0 0 0 0     SAB TAB Ectopic Multiple Live Births   0 0 0           Home Medications    Prior to Admission medications   Medication Sig Start Date End Date Taking? Authorizing Provider  ciprofloxacin (CIPRO) 250 MG tablet Take 1 tablet (250 mg total) by mouth every 12 (twelve) hours. 05/03/12   Loren Raceravid Yelverton, MD  ciprofloxacin (CIPRO) 500 MG tablet Take 1 tablet (500 mg total) by mouth 2 (two) times daily. One po bid x 7 days 07/05/12   April Palumbo, MD  ciprofloxacin (CIPRO) 500 MG tablet Take 1 tablet (500 mg total) by mouth 2 (two) times daily. 02/24/15   Teressa LowerVrinda Pickering, NP  dicyclomine (BENTYL) 20 MG tablet Take 1 tablet (20 mg total) by mouth 2 (two) times daily as needed for spasms. 10/23/14   Loren Raceravid  Yelverton, MD  Methenamine-Sodium Salicylate (CYSTEX PO) Take 2 tablets by mouth once as needed. For UTI symptoms      Historical Provider, MD  nitrofurantoin, macrocrystal-monohydrate, (MACROBID) 100 MG capsule Take 1 capsule (100 mg total) by mouth 2 (two) times daily. 04/22/12   Lowell BoutonBarbara A Beck, PA-C  omeprazole (PRILOSEC) 20 MG capsule Take 1 capsule (20 mg total) by mouth daily. 07/20/15   Shon Batonourtney F Horton, MD  ondansetron (ZOFRAN ODT) 4 MG disintegrating tablet Take 1 tablet (4 mg total) by mouth every 8 (eight) hours as needed for nausea or vomiting. 07/20/15   Shon Batonourtney F Horton, MD  polyethylene glycol (MIRALAX / GLYCOLAX) packet Take 17 g by mouth daily. 10/23/14   Loren Raceravid Yelverton, MD  tamoxifen (NOLVADEX) 10 MG tablet Take 10 mg by mouth 2 (two) times daily.      Historical Provider, MD    Family History No family history on file.  Social History Social History  Substance Use Topics  . Smoking status: Current Some Day Smoker  . Smokeless tobacco: Never Used  . Alcohol use 1.2 oz/week    2 Cans of beer per week     Allergies   Estrogens and Sulfa antibiotics   Review of Systems Review of Systems  All other systems reviewed and are negative.    Physical Exam Updated Vital Signs BP  138/80   Pulse 74   Temp 98 F (36.7 C) (Oral)   Resp 18   LMP 07/04/2012   SpO2 99%   Physical Exam  Constitutional: She appears well-developed and well-nourished. No distress.  Moderately obese female sitting in bed in no acute discomfort.  HENT:  Head: Atraumatic.  Eyes: Conjunctivae are normal.  Neck: Neck supple.  Cardiovascular: Normal rate and regular rhythm.   Pulmonary/Chest: Effort normal and breath sounds normal.  Abdominal: Soft. Bowel sounds are normal. She exhibits no distension. There is no tenderness.  Genitourinary:  Genitourinary Comments: No CVA tenderness  Neurological: She is alert.  Skin: No rash noted.  Psychiatric: She has a normal mood and affect.  Nursing  note and vitals reviewed.    ED Treatments / Results  Labs (all labs ordered are listed, but only abnormal results are displayed) Labs Reviewed  URINALYSIS, ROUTINE W REFLEX MICROSCOPIC (NOT AT [facility]) - Abnormal; Notable for the following:       Result Value   Glucose, UA >1000 (*)    Hgb urine dipstick LARGE (*)    Leukocytes, UA SMALL (*)    All other components within normal limits  URINE MICROSCOPIC-ADD ON - Abnormal; Notable for the following:    Squamous Epithelial / LPF 0-5 (*)    Bacteria, UA MANY (*)    All other components within normal limits  CBG MONITORING, ED - Abnormal; Notable for the following:    Glucose-Capillary 287 (*)    All other components within normal limits  URINE CULTURE    EKG  EKG Interpretation None       Radiology No results found.  Procedures Procedures (including critical care time)  Medications Ordered in ED Medications - No data to display   Initial Impression / Assessment and Plan / ED Course  I have reviewed the triage vital signs and the nursing notes.  Pertinent labs & imaging results that were available during my care of the patient were reviewed by me and considered in my medical decision making (see chart for details).  Clinical Course    BP 138/80   Pulse 74   Temp 98 F (36.7 C) (Oral)   Resp 18   LMP 07/04/2012   SpO2 99%    Final Clinical Impressions(s) / ED Diagnoses   Final diagnoses:  Lower urinary tract infectious disease  Hyperglycemia    New Prescriptions New Prescriptions   FLUCONAZOLE (DIFLUCAN) 150 MG TABLET    Take 1 tablet (150 mg total) by mouth once.   11:55 AM Patient presents with dysuria, history of recurrent UTI. No other symptoms. We'll check UA.  1:41 PM UA with some finding to suggest a urine tract infection. Since patient is symptomatic, will treat with ciprofloxacin as it has worked well for patient in the past. Urine culture sent. She does have greater than 1000 glucose in her  urine therefore a fingerstick was performed and her glucose is 287. Patient attributed the elevated blood sugar due to her heavy meal that she had early this morning. I encouraged patient to follow-up closely with her primary care Dr. for further evaluation for potential diabetes. Return precaution discussed. Pt also request diflucan for potential yeast infection when on abx.     Fayrene HelperBowie Sharif Rendell, PA-C 02/17/16 1345    Heide Scaleshristopher J Tegeler, MD 02/17/16 [phone]

## 2016-02-17 NOTE — ED Notes (Signed)
Pt encouraged to f/u with her PCP regarding elevated blood sugar. Pt reports she has talked to her doctor and is seeing her next week.

## 2016-02-17 NOTE — ED Triage Notes (Signed)
Urgency yesterday. Worse this am.

## 2016-02-17 NOTE — ED Notes (Signed)
Pt directed to pharmacy to pick up prescription 

## 2016-02-19 LAB — URINE CULTURE: Culture: >=100000 — AB

## 2016-02-20 ENCOUNTER — Telehealth (HOSPITAL_BASED_OUTPATIENT_CLINIC_OR_DEPARTMENT_OTHER): Payer: Self-pay | Admitting: Emergency Medicine

## 2016-02-20 NOTE — Progress Notes (Addendum)
ED Antimicrobial Stewardship Positive Culture Follow Up   Yolanda MandrilCynthia Carr is an 55 y.o. female who presented to [facility] on 02/17/2016 with a chief complaint of  Chief Complaint  Patient presents with  . Dysuria    Recent Results (from the past 720 hour(s))  Urine culture     Status: Abnormal   Collection Time: 02/17/16 11:16 AM  Result Value Ref Range Status   Specimen Description URINE, RANDOM  Final   Special Requests NONE  Final   Culture >=100,000 COLONIES/mL ESCHERICHIA COLI (A)  Final   Report Status 02/19/2016 FINAL  Final   Organism ID, Bacteria ESCHERICHIA COLI (A)  Final      Susceptibility   Escherichia coli - MIC*    AMPICILLIN >=32 RESISTANT Resistant     CEFAZOLIN <=4 SENSITIVE Sensitive     CEFTRIAXONE <=1 SENSITIVE Sensitive     CIPROFLOXACIN >=4 RESISTANT Resistant     GENTAMICIN <=1 SENSITIVE Sensitive     IMIPENEM <=0.25 SENSITIVE Sensitive     NITROFURANTOIN <=16 SENSITIVE Sensitive     TRIMETH/SULFA <=20 SENSITIVE Sensitive     AMPICILLIN/SULBACTAM 16 INTERMEDIATE Intermediate     PIP/TAZO <=4 SENSITIVE Sensitive     Extended ESBL NEGATIVE Sensitive     * >=100,000 COLONIES/mL ESCHERICHIA COLI    [x]  Treated with Cipro, organism resistant to prescribed antimicrobial.   New antibiotic prescription: Discontinue Cipro. Start Nitrofurantoin (Macrobid) 100mg  po BID x 5 days.  ED Provider: Harolyn RutherfordShawn Joy, PA  Allie BossierApryl Anderson, PharmD PGY1 Pharmacy Resident [phone]) 02/20/2016 8:58 AM

## 2016-02-20 NOTE — Telephone Encounter (Signed)
Post ED Visit - Positive Culture Follow-up: Successful Patient Follow-Up  Culture assessed and recommendations reviewed by: []  Enzo BiNathan Batchelder, Pharm.D. []  Celedonio MiyamotoJeremy Frens, Pharm.D., BCPS []  Garvin FilaMike Maccia, Pharm.D. []  Georgina PillionElizabeth Martin, Pharm.D., BCPS []  DendronMinh Pham, [address]Pharm.D., BCPS, AAHIVP []  Estella HuskMichelle Turner, Pharm.D., BCPS, AAHIVP []  Tennis Mustassie Stewart, Pharm.D. []  Rob Oswaldo DoneVincent, [address] BoulevardPharm[address] Audie PintoA. Anderson PharmD  Positive urine culture  []  Patient discharged without antimicrobial prescription and treatment is now indicated [x]  Organism is resistant to prescribed ED discharge antimicrobial []  Patient with positive blood cultures  Changes discussed with ED provider: Harolyn RutherfordShawn Joy PA New antibiotic prescription stop ciprofloxacin , start macrobid 100mg  po bid x 5 days [facility] [address] [place] @ [phone]  Contacted patient @ [phone]   Yolanda Carr, Yolanda Carr 02/20/2016, 12:28 PM

## 2016-03-02 ENCOUNTER — Telehealth (HOSPITAL_COMMUNITY): Payer: Self-pay

## 2016-03-02 NOTE — Telephone Encounter (Signed)
Call rcvd from pt Abx changed 11/6 [facility]  Pt originally prescribed diflucan 150 mg tab 1 x dose and is in need of another dose since starting new abx.  Dr Clayborne DanaMesner consulted via telephone and new order rcvd and verified for "Fluconazole (Diflucan) 150 mg tab, 1 x dose no refills"  Z Rx called and [facility] [phone]

## 2016-05-30 ENCOUNTER — Emergency Department (HOSPITAL_BASED_OUTPATIENT_CLINIC_OR_DEPARTMENT_OTHER)
Admission: EM | Admit: 2016-05-30 | Discharge: 2016-05-30 | Disposition: A | Discharge status: Home / Self Care | Attending: Emergency Medicine | Admitting: Emergency Medicine

## 2016-05-30 ENCOUNTER — Encounter (HOSPITAL_BASED_OUTPATIENT_CLINIC_OR_DEPARTMENT_OTHER): Payer: Self-pay | Admitting: Emergency Medicine

## 2016-05-30 DIAGNOSIS — J029 Acute pharyngitis, unspecified: Secondary | ICD-10-CM | POA: Diagnosis present

## 2016-05-30 DIAGNOSIS — J069 Acute upper respiratory infection, unspecified: Secondary | ICD-10-CM | POA: Insufficient documentation

## 2016-05-30 DIAGNOSIS — J0100 Acute maxillary sinusitis, unspecified: Secondary | ICD-10-CM | POA: Diagnosis not present

## 2016-05-30 LAB — RAPID STREP SCREEN (MED CTR MEBANE ONLY): STREPTOCOCCUS, GROUP A SCREEN (DIRECT): NEGATIVE

## 2016-05-30 MED ORDER — LORATADINE 10 MG PO TABS: 10.0000 mg | ORAL_TABLET | Freq: Every day | ORAL | 0 refills | Status: AC

## 2016-05-30 MED ORDER — OXYMETAZOLINE HCL 0.05 % NA SOLN: 2.0000 | Freq: Two times a day (BID) | NASAL | 0 refills | Status: AC

## 2016-05-30 MED ORDER — FLUTICASONE PROPIONATE 50 MCG/ACT NA SUSP: 2.0000 | Freq: Every day | NASAL | 0 refills | Status: AC

## 2016-05-30 MED FILL — FLUTICASONE PROP 50 MCG SPR: 50 | 30 days supply | Qty: 16 | Fill #0

## 2016-05-30 NOTE — ED Triage Notes (Signed)
Bil ear pain and sore throat x 3 days. Also has a productive cough, white mucus. Temp 101.2 this am. Took Motrin at 1100 and Tylenol at 1230

## 2016-05-30 NOTE — ED Provider Notes (Signed)
[facility] Provider Note   CSN: [id number] Arrival date & time: 05/30/16  1345     History   Chief Complaint Chief Complaint  Patient presents with  . Sore Throat    HPI Yolanda Carr is a 56 y.o. female.  HPI Patient presents with 3-4 days of nasal congestion, sinus pressure, pressure in bilateral ears, sore throat and cough with clear sputum. No shortness of breath or chest pain. States she's had intermittent fever. No neck pain or stiffness. Past Medical History:  Diagnosis Date  . History of recurrent UTIs   . Thyroid disease     There are no active problems to display for this patient.   Past Surgical History:  Procedure Laterality Date  . BIOPSY BREAST    . CESAREAN SECTION  1996    OB History    Gravida Para Term Preterm AB Living   0 0 0 0 0     SAB TAB Ectopic Multiple Live Births   0 0 0           Home Medications    Prior to Admission medications   Medication Sig Start Date End Date Taking? Authorizing Provider  Methenamine-Sodium Salicylate (CYSTEX PO) Take 2 tablets by mouth once as needed. For UTI symptoms     Yes Historical Provider, MD  polyethylene glycol (MIRALAX / GLYCOLAX) packet Take 17 g by mouth daily. 10/23/14  Yes Loren Racer, MD  tamoxifen (NOLVADEX) 10 MG tablet Take 10 mg by mouth 2 (two) times daily.     Yes Historical Provider, MD  ciprofloxacin (CIPRO) 500 MG tablet Take 1 tablet (500 mg total) by mouth 2 (two) times daily. One po bid x 7 days 02/17/16   Fayrene Helper, PA-C  dicyclomine (BENTYL) 20 MG tablet Take 1 tablet (20 mg total) by mouth 2 (two) times daily as needed for spasms. 10/23/14   Loren Racer, MD  omeprazole (PRILOSEC) 20 MG capsule Take 1 capsule (20 mg total) by mouth daily. 07/20/15   Shon Baton, MD  ondansetron (ZOFRAN ODT) 4 MG disintegrating tablet Take 1 tablet (4 mg total) by mouth every 8 (eight) hours as needed for nausea or vomiting. 07/20/15   Shon Baton, MD    Family  History No family history on file.  Social History Social History  Substance Use Topics  . Smoking status: Never Smoker  . Smokeless tobacco: Never Used  . Alcohol use Yes     Comment: socially      Allergies   Estrogens and Sulfa antibiotics   Review of Systems Review of Systems  Constitutional: Positive for fatigue and fever. Negative for chills.  HENT: Positive for congestion, ear pain, rhinorrhea, sinus pain, sinus pressure and sore throat. Negative for ear discharge, trouble swallowing and voice change.   Eyes: Negative for photophobia and visual disturbance.  Respiratory: Positive for cough. Negative for chest tightness, shortness of breath and wheezing.   Cardiovascular: Negative for chest pain.  Gastrointestinal: Negative for abdominal pain, constipation, diarrhea, nausea and vomiting.  Genitourinary: Negative for dysuria and flank pain.  Musculoskeletal: Negative for back pain, myalgias, neck pain and neck stiffness.  Skin: Negative for rash and wound.  Neurological: Positive for headaches. Negative for dizziness, weakness, light-headedness and numbness.  All other systems reviewed and are negative.    Physical Exam Updated Vital Signs BP 148/100 (BP Location: Left Arm)   Pulse 87   Temp 98.2 F (36.8 C) (Oral)   Resp 20  Ht 5\' 4"  (1.626 m)   Wt 176 lb (79.8 kg)   LMP 07/04/2012   SpO2 98%   BMI 30.21 kg/m   Physical Exam  Constitutional: She is oriented to person, place, and time. She appears well-developed and well-nourished. No distress.  HENT:  Head: Normocephalic and atraumatic.  Mouth/Throat: Oropharynx is clear and moist. No oropharyngeal exudate.  Bilateral nasal mucosal edema. Mild bulging bilateral TMs without erythema. Oropharynx is erythematous without tonsillar hypertrophy or exudates. Uvula is midline. Patient does have tenderness to percussion over the bilateral maxillary and left frontal sinuses.  Eyes: EOM are normal. Pupils are equal,  round, and reactive to light.  Neck: Normal range of motion. Neck supple.  No meningismus  Cardiovascular: Normal rate and regular rhythm.   Pulmonary/Chest: Effort normal and breath sounds normal. No respiratory distress. She has no wheezes. She has no rales. She exhibits no tenderness.  Abdominal: Soft. Bowel sounds are normal. There is no tenderness. There is no rebound and no guarding.  Musculoskeletal: Normal range of motion. She exhibits no edema or tenderness.  No lower extremity swelling, asymmetry or tenderness.  Lymphadenopathy:    She has no cervical adenopathy.  Neurological: She is alert and oriented to person, place, and time.  Moving all extremities without deficit. Sensation intact. Ambulating without difficulty.  Skin: Skin is warm and dry. Capillary refill takes less than 2 seconds. No rash noted. She is not diaphoretic. No erythema.  Psychiatric: She has a normal mood and affect. Her behavior is normal.  Nursing note and vitals reviewed.    ED Treatments / Results  Labs (all labs ordered are listed, but only abnormal results are displayed) Labs Reviewed  RAPID STREP SCREEN (NOT AT [facility])  CULTURE, GROUP A STREP [facility])    EKG  EKG Interpretation None       Radiology No results found.  Procedures Procedures (including critical care time)  Medications Ordered in ED Medications - No data to display   Initial Impression / Assessment and Plan / ED Course  I have reviewed the triage vital signs and the nursing notes.  Pertinent labs & imaging results that were available during my care of the patient were reviewed by me and considered in my medical decision making (see chart for details).     Appears to have URI and sinusitis. Will treat with nasal steroids and antihistamine. Low suspicion for pneumonia. Advised follow-up with primary physician. Return precautions given.  Final Clinical Impressions(s) / ED Diagnoses   Final diagnoses:  Upper  respiratory tract infection, unspecified type  Acute maxillary sinusitis, recurrence not specified    New Prescriptions New Prescriptions   No medications on file     Loren Raceravid Dondre Catalfamo, MD 05/30/16 1436

## 2016-05-31 ENCOUNTER — Telehealth: Payer: Self-pay | Admitting: *Deleted

## 2016-05-31 ENCOUNTER — Encounter (HOSPITAL_BASED_OUTPATIENT_CLINIC_OR_DEPARTMENT_OTHER): Payer: Self-pay | Admitting: Emergency Medicine

## 2016-05-31 ENCOUNTER — Emergency Department (HOSPITAL_BASED_OUTPATIENT_CLINIC_OR_DEPARTMENT_OTHER)
Admission: EM | Admit: 2016-05-31 | Discharge: 2016-05-31 | Disposition: A | Discharge status: Home / Self Care | Attending: Emergency Medicine | Admitting: Emergency Medicine

## 2016-05-31 DIAGNOSIS — J329 Chronic sinusitis, unspecified: Secondary | ICD-10-CM

## 2016-05-31 DIAGNOSIS — J069 Acute upper respiratory infection, unspecified: Secondary | ICD-10-CM | POA: Diagnosis not present

## 2016-05-31 DIAGNOSIS — J029 Acute pharyngitis, unspecified: Secondary | ICD-10-CM | POA: Diagnosis present

## 2016-05-31 MED ORDER — AMOXICILLIN-POT CLAVULANATE 875-125 MG PO TABS
1.0000 | ORAL_TABLET | Freq: Two times a day (BID) | ORAL | 0 refills | Status: AC
Start: 2016-05-31 — End: 2016-06-05

## 2016-05-31 MED ORDER — BENZONATATE 100 MG PO CAPS: 100.0000 mg | ORAL_CAPSULE | Freq: Three times a day (TID) | ORAL | 0 refills | Status: AC

## 2016-05-31 MED FILL — AMOX-CLAV 875-125 MG TABLET: 875-125 | 5 days supply | Qty: 10 | Fill #0

## 2016-05-31 NOTE — ED Provider Notes (Signed)
[facility] Provider Note   CSN: [id number] Arrival date & time: 05/31/16  0930     History   Chief Complaint Chief Complaint  Patient presents with  . Otalgia    HPI Yolanda Carr is a 56 y.o. female presenting today after presenting to the ED yesterday with diagnosed URI and sinusitis complaining of continued worsening symptoms of sinus pressure and sore throat. She states her symptoms have now been going on for 5 days with no change. She reports associated productive cough with green sputum, chills, subjective low grade fever. She states she has tried "everything" over the counter, the flonase, and claritin prescribed. She denies neck pain and neck stiffness.   The history is provided by the patient. No language interpreter was used.    Past Medical History:  Diagnosis Date  . History of recurrent UTIs   . Thyroid disease     There are no active problems to display for this patient.   Past Surgical History:  Procedure Laterality Date  . BIOPSY BREAST    . CESAREAN SECTION  1996    OB History    Gravida Para Term Preterm AB Living   0 0 0 0 0     SAB TAB Ectopic Multiple Live Births   0 0 0           Home Medications    Prior to Admission medications   Medication Sig Start Date End Date Taking? Authorizing Provider  amoxicillin-clavulanate (AUGMENTIN) 875-125 MG tablet Take 1 tablet by mouth 2 (two) times daily. 05/31/16 06/05/16  Sadia Belfiore Manuel Cynara [place], [place]  ciprofloxacin (CIPRO) 500 MG tablet Take 1 tablet (500 mg total) by mouth 2 (two) times daily. One po bid x 7 days 02/17/16   Fayrene Helper, PA-C  dicyclomine (BENTYL) 20 MG tablet Take 1 tablet (20 mg total) by mouth 2 (two) times daily as needed for spasms. 10/23/14   Loren Racer, MD  fluticasone (FLONASE) 50 MCG/ACT nasal spray Place 2 sprays into both nostrils daily. 05/30/16   Loren Racer, MD  loratadine (CLARITIN) 10 MG tablet Take 1 tablet (10 mg total) by mouth daily. 05/30/16   Loren Racer, MD  Methenamine-Sodium Salicylate (CYSTEX PO) Take 2 tablets by mouth once as needed. For UTI symptoms      Historical Provider, MD  omeprazole (PRILOSEC) 20 MG capsule Take 1 capsule (20 mg total) by mouth daily. 07/20/15   Shon Baton, MD  ondansetron (ZOFRAN ODT) 4 MG disintegrating tablet Take 1 tablet (4 mg total) by mouth every 8 (eight) hours as needed for nausea or vomiting. 07/20/15   Shon Baton, MD  oxymetazoline (AFRIN NASAL SPRAY) 0.05 % nasal spray Place 2 sprays into both nostrils 2 (two) times daily. 05/30/16 06/02/16  Loren Racer, MD  polyethylene glycol [facility]) packet Take 17 g by mouth daily. 10/23/14   Loren Racer, MD  tamoxifen (NOLVADEX) 10 MG tablet Take 10 mg by mouth 2 (two) times daily.      Historical Provider, MD    Family History History reviewed. No pertinent family history.  Social History Social History  Substance Use Topics  . Smoking status: Never Smoker  . Smokeless tobacco: Never Used  . Alcohol use Yes     Comment: socially      Allergies   Estrogens and Sulfa antibiotics   Review of Systems Review of Systems  Constitutional: Positive for chills and fever.  HENT: Positive for sore throat.  Physical Exam Updated Vital Signs LMP 07/04/2012   Physical Exam  Constitutional: She is oriented to person, place, and time. She appears well-developed and well-nourished.  Well appearing  HENT:  Head: Normocephalic and atraumatic.  Right Ear: External ear normal.  Left Ear: External ear normal.  Nose: Nose normal.  Mouth/Throat: Oropharynx is clear and moist. No oropharyngeal exudate.  Oropharynx without evidence of redness or exudates. Tonsils did have some swelling. Otherwise without evidence of redness or exudates. TM's appear normal with no evidence of bulging. EAC appear non erythematous and not swollen  Eyes: EOM are normal. Pupils are equal, round, and reactive to light.  Neck: Normal range of  motion.  Normal ROM. No nuchal rigidity.   Cardiovascular: Normal rate and normal heart sounds.   Pulmonary/Chest: Effort normal and breath sounds normal. No respiratory distress. She has no wheezes. She has no rales.  Lungs CTA. No wheezing. No rales. No stridor. Normal work of breathing  Abdominal: Soft. There is no tenderness. There is no rebound and no guarding.  Soft and nontender. No rebound. No guarding. Negative murphy's sign. No focal tenderness at McBurney's point. No CVA tenderness. No evidence of hernia  Neurological: She is alert and oriented to person, place, and time.  Skin: Skin is warm.  Psychiatric: She has a normal mood and affect. Her behavior is normal.  Nursing note and vitals reviewed.    ED Treatments / Results  Labs (all labs ordered are listed, but only abnormal results are displayed) Labs Reviewed - No data to display  EKG  EKG Interpretation None       Radiology No results found.  Procedures Procedures (including critical care time)  Medications Ordered in ED Medications - No data to display   Initial Impression / Assessment and Plan / ED Course  I have reviewed the triage vital signs and the nursing notes.  Pertinent labs & imaging results that were available during my care of the patient were reviewed by me and considered in my medical decision making (see chart for details).     Mild to moderate symptoms of clear/yellow nasal discharge/congestion and scratchy throat with cough and symptoms consistent with URI. Concern for acute bacterial rhinosinusitis.  Patient discharged with Augmentin.  TM's clear with no bulging. Instructions given for warm saline nasal wash, continue symptomatic treatment, and recommendations for follow-up with primary care physician.   Discussed return precautions.  Pt is hemodynamically stable & in NAD prior to discharge.   Final Clinical Impressions(s) / ED Diagnoses   Final diagnoses:  Sinusitis, unspecified  chronicity, unspecified location  Upper respiratory tract infection, unspecified type    New Prescriptions New Prescriptions   AMOXICILLIN-CLAVULANATE (AUGMENTIN) 875-125 MG TABLET    Take 1 tablet by mouth 2 (two) times daily.     [address] [place], [place] 05/31/16 [address]22    Jeri Jeanbaptiste Manuel [place], [place] 05/31/16 1022    Benjiman CoreNathan Pickering, MD 05/31/16 [phone]

## 2016-05-31 NOTE — Discharge Instructions (Signed)
Wait 3 days. If symptoms have worsened or not improved take augmentin twice a day for 5 days.  1. Medications: flonase, mucinex, tessalon, usual home medications 2. Treatment: rest, drink plenty of fluids, take tylenol or ibuprofen for fever control 3. Follow Up: Please followup with your primary doctor in 3 days for discussion of your diagnoses and further evaluation after today's visit; if you do not have a primary care doctor use the resource guide provided to find one; Return to the ER for high fevers, difficulty breathing or other concerning symptoms

## 2016-05-31 NOTE — Telephone Encounter (Signed)
Pt called stating Rx written for her yesterday "are not working" and she wanted something else called in. EDCM advised her that since this is an ED and not a PCP setting, the EDP she saw yesterday is not here and that she would have to be seen again (ED or Urgent Care) or to visit her PCP.  Pt got upset and asked EDCM to call the EDP who treated her yesterday "you might have to call him home" to get her an antibiotic.  EDCM advised that locating her EDP would be impossible at this time and reminded her of seeking additional treatment option.

## 2016-05-31 NOTE — ED Triage Notes (Signed)
Pt reports being seen here for uri sx, pt demands antibiotic, states "this isn't going to get no better without them, I've been through this many times before and I need an antibiotic!" pt seen here yesterday, "I'm not happy I didn't get the antibiotic I need."

## 2016-06-01 LAB — CULTURE, GROUP A STREP (THRC)

## 2016-06-08 ENCOUNTER — Emergency Department (HOSPITAL_BASED_OUTPATIENT_CLINIC_OR_DEPARTMENT_OTHER)
Admission: EM | Admit: 2016-06-08 | Discharge: 2016-06-09 | Disposition: A | Discharge status: Home / Self Care | Attending: Emergency Medicine | Admitting: Emergency Medicine

## 2016-06-08 ENCOUNTER — Encounter (HOSPITAL_BASED_OUTPATIENT_CLINIC_OR_DEPARTMENT_OTHER): Payer: Self-pay | Admitting: *Deleted

## 2016-06-08 DIAGNOSIS — N898 Other specified noninflammatory disorders of vagina: Secondary | ICD-10-CM | POA: Diagnosis present

## 2016-06-08 DIAGNOSIS — N760 Acute vaginitis: Secondary | ICD-10-CM | POA: Diagnosis not present

## 2016-06-08 LAB — URINALYSIS, ROUTINE W REFLEX MICROSCOPIC
BILIRUBIN URINE: NEGATIVE
GLUCOSE, UA: NEGATIVE mg/dL
Hgb urine dipstick: NEGATIVE
KETONES UR: NEGATIVE mg/dL
NITRITE: NEGATIVE
PROTEIN: NEGATIVE mg/dL
Specific Gravity, Urine: 1.013 (ref 1.005–1.030)
pH: 6 (ref 5.0–8.0)

## 2016-06-08 LAB — URINALYSIS, MICROSCOPIC (REFLEX)

## 2016-06-08 NOTE — ED Triage Notes (Signed)
Pt currently on abx reports vaginal DC and external  pain with urination reports vaginal irritation. Denies frequency urgency back pain or fevers

## 2016-06-08 NOTE — ED Provider Notes (Signed)
[facility] Provider Note   CSN: [id number] Arrival date & time: 06/08/16  1944  By signing my name below, I, Modena JanskyAlbert Thayil, attest that this documentation has been prepared under the direction and in the presence of non-physician practitioner, Mathews RobinsonsJessica Torrez Renfroe, PA-C. Electronically Signed: Modena JanskyAlbert Thayil, Scribe. 06/08/2016. 11:14 PM.  History   Chief Complaint Chief Complaint  Patient presents with  . Vaginal Discharge   The history is provided by the patient. No language interpreter was used.   HPI Comments: Yolanda Carr is a 56 y.o. female who presents to the Emergency Department complaining of intermitent dysuria that started about a week ago. She was started on augmentin for a sinus infection (now resolved). She has a prior hx of yeast infection with antibiotic medication, and she suspects a yeast infection is causing her current symptoms. She took monistat for symptoms the past 2 days with some relief. She describes the pain as a burning sensation. She reports associated vaginal itching (initially). She is currently sexually active with no new partners. She denies any fever, chills, urinary urgency, or back pain.     PCP: Anselmo PicklerACHREJA, MANJEET KAUR, MD  Past Medical History:  Diagnosis Date  . History of recurrent UTIs   . Thyroid disease     There are no active problems to display for this patient.   Past Surgical History:  Procedure Laterality Date  . BIOPSY BREAST    . CESAREAN SECTION  1996    OB History    Gravida Para Term Preterm AB Living   0 0 0 0 0     SAB TAB Ectopic Multiple Live Births   0 0 0           Home Medications    Prior to Admission medications   Medication Sig Start Date End Date Taking? Authorizing Provider  benzonatate (TESSALON) 100 MG capsule Take 1 capsule (100 mg total) by mouth every 8 (eight) hours. 05/31/16   Francisco Manuel North Johns[place]spina, [place]  ciprofloxacin (CIPRO) 500 MG tablet Take 1 tablet (500 mg total) by mouth 2 (two)  times daily. One po bid x 7 days 02/17/16   Fayrene HelperBowie Tran, PA-C  dicyclomine (BENTYL) 20 MG tablet Take 1 tablet (20 mg total) by mouth 2 (two) times daily as needed for spasms. 10/23/14   Loren Raceravid Yelverton, MD  fluticasone (FLONASE) 50 MCG/ACT nasal spray Place 2 sprays into both nostrils daily. 05/30/16   Loren Raceravid Yelverton, MD  loratadine (CLARITIN) 10 MG tablet Take 1 tablet (10 mg total) by mouth daily. 05/30/16   Loren Raceravid Yelverton, MD  Methenamine-Sodium Salicylate (CYSTEX PO) Take 2 tablets by mouth once as needed. For UTI symptoms      Historical Provider, MD  omeprazole (PRILOSEC) 20 MG capsule Take 1 capsule (20 mg total) by mouth daily. 07/20/15   Shon Batonourtney F Horton, MD  ondansetron (ZOFRAN ODT) 4 MG disintegrating tablet Take 1 tablet (4 mg total) by mouth every 8 (eight) hours as needed for nausea or vomiting. 07/20/15   Shon Batonourtney F Horton, MD  polyethylene glycol (MIRALAX / GLYCOLAX) packet Take 17 g by mouth daily. 10/23/14   Loren Raceravid Yelverton, MD  tamoxifen (NOLVADEX) 10 MG tablet Take 10 mg by mouth 2 (two) times daily.      Historical Provider, MD    Family History History reviewed. No pertinent family history.  Social History Social History  Substance Use Topics  . Smoking status: Never Smoker  . Smokeless tobacco: Never Used  . Alcohol use Yes  Comment: socially      Allergies   Estrogens and Sulfa antibiotics   Review of Systems Review of Systems  Constitutional: Negative for chills and fever.  HENT: Negative for congestion, sinus pain and sinus pressure.   Respiratory: Negative for apnea, chest tightness, shortness of breath, wheezing and stridor.   Cardiovascular: Negative for chest pain, palpitations and leg swelling.  Gastrointestinal: Negative for abdominal distention, abdominal pain, blood in stool, nausea and vomiting.  Genitourinary: Positive for dysuria and vaginal discharge. Negative for difficulty urinating, flank pain, frequency, hematuria, pelvic pain and vaginal  bleeding.       White curd discharge. Burning to vulva with urination.   Musculoskeletal: Negative for back pain.  Skin: Negative for color change, pallor and rash.  Neurological: Negative for dizziness and weakness.     Physical Exam Updated Vital Signs BP 109/69   Pulse 75   Temp 98.3 F (36.8 C) (Oral)   Resp 20   Ht 5\' 4"  (1.626 m)   Wt 169 lb (76.7 kg)   LMP 07/04/2012   SpO2 96%   BMI 29.01 kg/m   Physical Exam  Constitutional: She is oriented to person, place, and time. She appears well-developed and well-nourished. No distress.   Patient is afebrile, non-toxic appearing, seating comfortably in chair in no acute distress.   HENT:  Head: Normocephalic.  Eyes: Conjunctivae and EOM are normal.  Neck: Normal range of motion. Neck supple.  Cardiovascular: Normal rate, regular rhythm, normal heart sounds and intact distal pulses.   Pulmonary/Chest: Effort normal and breath sounds normal. No respiratory distress. She has no wheezes. She has no rales.  Abdominal: Soft. She exhibits no distension. There is no tenderness. There is no rebound and no guarding.  Genitourinary:  Genitourinary Comments: vulva with multiple 1 to 2 mm ulcerations, yellow center surrounded by erythematous border. Thick, white curd discharge on pelvic exam  Musculoskeletal: Normal range of motion.  Neurological: She is alert and oriented to person, place, and time.  Skin: Skin is warm and dry. She is not diaphoretic.  Psychiatric: She has a normal mood and affect.  Nursing note and vitals reviewed.    ED Treatments / Results  DIAGNOSTIC STUDIES: Oxygen Saturation is 96% on RA, normal by my interpretation.    COORDINATION OF CARE: 11:18 PM- Pt advised of plan for treatment and pt agrees.  Labs (all labs ordered are listed, but only abnormal results are displayed) Labs Reviewed  WET PREP, GENITAL - Abnormal; Notable for the following:       Result Value   WBC, Wet Prep HPF POC MANY (*)     All other components within normal limits  URINALYSIS, ROUTINE W REFLEX MICROSCOPIC - Abnormal; Notable for the following:    APPearance CLOUDY (*)    Leukocytes, UA TRACE (*)    All other components within normal limits  URINALYSIS, MICROSCOPIC (REFLEX) - Abnormal; Notable for the following:    Bacteria, UA RARE (*)    Squamous Epithelial / LPF 0-5 (*)    All other components within normal limits  HSV CULTURE AND TYPING    EKG  EKG Interpretation None       Radiology No results found.  Procedures Procedures (including critical care time)  Medications Ordered in ED Medications - No data to display   Initial Impression / Assessment and Plan / ED Course  I have reviewed the triage vital signs and the nursing notes.  Pertinent labs & imaging results that were available  during my care of the patient were reviewed by me and considered in my medical decision making (see chart for details).     56 year old female otherwise healthy presenting with white curd vaginal discharge and pruritus that is currently improving. She states that she has had these infections in the past when she was put on antibiotics and was recently taking Augmentin for sinusitis. She has been treating the infection with Monistat over-the-counter. She now notes some burning sensation of her vulva with urine contact. This is new to her and concerning. She reports being currently sexually active with one partner for the past 7 years. No past history of herpes.  Pelvic exam revealed normal cervix with white curd discharge. Exam is otherwise unremarkable. Patient is well-appearing, nontoxic, afebrile and sitting comfortably in bed in no acute distress.  Urine analysis negative for UTI. Wet prep without clue cells, trichomoniasis or budding yeast.  Lesions suspicious for genital herpes. Obtained cultures. Patient was advised to avoid sexual contacts. She will be contacted if results are positive.  Patient was  discussed with Dr. Read Drivers who also has seen patient and agrees with assessment and plan.   Discussed strict return precautions. Patient was advised to return to the emergency department if experiencing any new or worsening symptoms. Patient clearly understood instructions and agreed with discharge plan.  Final Clinical Impressions(s) / ED Diagnoses   Final diagnoses:  Acute vaginitis    New Prescriptions New Prescriptions   No medications on file   I personally performed the services described in this documentation, which was scribed in my presence. The recorded information has been reviewed and is accurate.     Georgiana Shore, PA-C 06/09/16 0139    Arby Barrette, MD 06/12/16 [phone]

## 2016-06-09 LAB — WET PREP, GENITAL
Clue Cells Wet Prep HPF POC: NONE SEEN
SPERM: NONE SEEN
Trich, Wet Prep: NONE SEEN
YEAST WET PREP: NONE SEEN

## 2016-06-11 LAB — HSV CULTURE AND TYPING

## 2016-06-15 ENCOUNTER — Ambulatory Visit: Payer: Self-pay | Admitting: Family

## 2016-08-26 ENCOUNTER — Encounter (HOSPITAL_BASED_OUTPATIENT_CLINIC_OR_DEPARTMENT_OTHER): Payer: Self-pay | Admitting: *Deleted

## 2016-08-26 ENCOUNTER — Emergency Department (HOSPITAL_BASED_OUTPATIENT_CLINIC_OR_DEPARTMENT_OTHER)
Admission: EM | Admit: 2016-08-26 | Discharge: 2016-08-26 | Disposition: A | Discharge status: Home / Self Care | Attending: Emergency Medicine | Admitting: Emergency Medicine

## 2016-08-26 DIAGNOSIS — R350 Frequency of micturition: Secondary | ICD-10-CM | POA: Diagnosis present

## 2016-08-26 DIAGNOSIS — N3000 Acute cystitis without hematuria: Secondary | ICD-10-CM | POA: Insufficient documentation

## 2016-08-26 LAB — URINALYSIS, MICROSCOPIC (REFLEX)

## 2016-08-26 LAB — URINALYSIS, ROUTINE W REFLEX MICROSCOPIC
BILIRUBIN URINE: NEGATIVE
Glucose, UA: NEGATIVE mg/dL
KETONES UR: NEGATIVE mg/dL
NITRITE: NEGATIVE
PROTEIN: NEGATIVE mg/dL
SPECIFIC GRAVITY, URINE: 1.02 (ref 1.005–1.030)
pH: 6 (ref 5.0–8.0)

## 2016-08-26 MED ORDER — CEPHALEXIN 500 MG PO CAPS: 500.0000 mg | ORAL_CAPSULE | Freq: Three times a day (TID) | ORAL | 0 refills | Status: AC

## 2016-08-26 MED ORDER — NITROFURANTOIN MONOHYD MACRO 100 MG PO CAPS: 100.0000 mg | ORAL_CAPSULE | Freq: Two times a day (BID) | ORAL | 0 refills | Status: DC

## 2016-08-26 MED ORDER — FLUCONAZOLE 200 MG PO TABS: 200.0000 mg | ORAL_TABLET | Freq: Every day | ORAL | 0 refills | Status: AC

## 2016-08-26 NOTE — ED Triage Notes (Signed)
Patient states she developed urinary urgency, frequency and discomfort with urination.

## 2016-08-26 NOTE — Discharge Instructions (Signed)
Take antibiotics as directed as directed.  Follow-up with her primary care doctor in 24-48 hours no improvement in symptoms.  Return the emergency Department for any worsening pain, fever, blood in urine, abdominal pain or any other worsening or concerning symptoms.

## 2016-08-26 NOTE — ED Provider Notes (Signed)
[facility]   CSN: [id number] Arrival date & time: 08/26/16  0827     History   Chief Complaint Chief Complaint  Patient presents with  . Urinary Frequency    HPI Yolanda Carr is a 56 y.o. female who presents with urinary frequency and dysuria that began yesterday. Patient states that she has a history of UTIs ever since going through menopause and states that this feels similar to her past episodes of UTIs. She drank cranberry juice with no relief of symptoms. She has not tried any other medications. She denies any fever, chest pain, difficulty breathing, abdominal pain, nausea/vomiting, hematuria.  The history is provided by the patient.    Past Medical History:  Diagnosis Date  . History of recurrent UTIs   . Thyroid disease     There are no active problems to display for this patient.   Past Surgical History:  Procedure Laterality Date  . BIOPSY BREAST    . CESAREAN SECTION  1996    OB History    Gravida Para Term Preterm AB Living   0 0 0 0 0     SAB TAB Ectopic Multiple Live Births   0 0 0           Home Medications    Prior to Admission medications   Medication Sig Start Date End Date Taking? Authorizing Provider  benzonatate (TESSALON) 100 MG capsule Take 1 capsule (100 mg total) by mouth every 8 (eight) hours. 05/31/16   Espina, Lucita Lora, PA  cephALEXin (KEFLEX) 500 MG capsule Take 1 capsule (500 mg total) by mouth 3 (three) times daily. 08/26/16 09/02/16  Alvira Monday, MD  ciprofloxacin (CIPRO) 500 MG tablet Take 1 tablet (500 mg total) by mouth 2 (two) times daily. One po bid x 7 days 02/17/16   Fayrene Helper, PA-C  dicyclomine (BENTYL) 20 MG tablet Take 1 tablet (20 mg total) by mouth 2 (two) times daily as needed for spasms. 10/23/14   Loren Racer, MD  fluconazole (DIFLUCAN) 200 MG tablet Take 1 tablet (200 mg total) by mouth daily. 08/26/16 09/02/16  Maxwell Caul, PA-C  fluticasone (FLONASE) 50 MCG/ACT  nasal spray Place 2 sprays into both nostrils daily. 05/30/16   Loren Racer, MD  loratadine (CLARITIN) 10 MG tablet Take 1 tablet (10 mg total) by mouth daily. 05/30/16   Loren Racer, MD  Methenamine-Sodium Salicylate (CYSTEX PO) Take 2 tablets by mouth once as needed. For UTI symptoms      [provider]  omeprazole (PRILOSEC) 20 MG capsule Take 1 capsule (20 mg total) by mouth daily. 07/20/15   Horton, Mayer Masker, MD  ondansetron (ZOFRAN ODT) 4 MG disintegrating tablet Take 1 tablet (4 mg total) by mouth every 8 (eight) hours as needed for nausea or vomiting. 07/20/15   Horton, Mayer Masker, MD  polyethylene glycol (MIRALAX / GLYCOLAX) packet Take 17 g by mouth daily. 10/23/14   Loren Racer, MD  tamoxifen (NOLVADEX) 10 MG tablet Take 10 mg by mouth 2 (two) times daily.      [provider]    Family History No family history on file.  Social History Social History  Substance Use Topics  . Smoking status: Never Smoker  . Smokeless tobacco: Never Used  . Alcohol use Yes     Comment: socially      Allergies   Estrogens and Sulfa antibiotics   Review of Systems Review of Systems  Constitutional: Negative for fever.  Respiratory:  Negative for shortness of breath.   Cardiovascular: Negative for chest pain.  Gastrointestinal: Negative for abdominal pain, nausea and vomiting.  Genitourinary: Positive for dysuria, frequency and urgency. Negative for hematuria, vaginal bleeding and vaginal pain.     Physical Exam Updated Vital Signs BP 122/64 (BP Location: Right Arm)   Pulse 78   Temp 98 F (36.7 C)   Resp 18   Ht 5\' 4"  (1.626 m)   Wt 72.6 kg   LMP 07/04/2012   SpO2 100%   BMI 27.46 kg/m   Physical Exam  Constitutional: She appears well-developed and well-nourished.  Sitting comfortably on examination table  HENT:  Head: Normocephalic and atraumatic.  Eyes: Conjunctivae and EOM are normal. Right eye exhibits no discharge. Left eye exhibits no  discharge. No scleral icterus.  Pulmonary/Chest: Effort normal.  Abdominal: Soft. Normal appearance. She exhibits no distension. There is no tenderness. There is no CVA tenderness (bilaterally).  Musculoskeletal: She exhibits no deformity.  Neurological: She is alert.  Skin: Skin is warm and dry. Capillary refill takes less than 2 seconds.  Psychiatric: She has a normal mood and affect. Her speech is normal and behavior is normal.     ED Treatments / Results  Labs (all labs ordered are listed, but only abnormal results are displayed) Labs Reviewed  URINALYSIS, ROUTINE W REFLEX MICROSCOPIC - Abnormal; Notable for the following:       Result Value   APPearance CLOUDY (*)    Hgb urine dipstick MODERATE (*)    Leukocytes, UA LARGE (*)    All other components within normal limits  URINALYSIS, MICROSCOPIC (REFLEX) - Abnormal; Notable for the following:    Bacteria, UA MANY (*)    Squamous Epithelial / LPF 6-30 (*)    All other components within normal limits    EKG  EKG Interpretation None       Radiology No results found.  Procedures Procedures (including critical care time)  Medications Ordered in ED Medications - No data to display   Initial Impression / Assessment and Plan / ED Course  I have reviewed the triage vital signs and the nursing notes.  Pertinent labs & imaging results that were available during my care of the patient were reviewed by me and considered in my medical decision making (see chart for details).     56 year old woman who presents with urinary frequency/urgency and dysuria that began yesterday. Feels similar to past episodes of UTI. She has recurrent radiation no relief of symptoms. No associated fevers, nausea/vomiting, abdominal pain. Physical exam shows no abdominal tenderness and no CVA tenderness bilaterally. Patient is afebrile, non-toxic appearing, sitting comfortably on examination table. Consider UTI. History/physical examination  concerning for pyelonephritis. Urinalysis ordered at triage.  UA reviewed and is suspicious for infection. Since patient is symptomatic will plan to treat for infection. Previous urine culture reviewed and shows that it was sensitive to Macrobid and resistant to Cipro. Will plan to treat her with Keflex. Will also provide Diflucan pill at patient's request. Patient instructed to follow up with her primary care doctor in 24-48 hours if no improvement in symptoms. Should return precautions given. Patient expresses understanding and agreement to plan.   Final Clinical Impressions(s) / ED Diagnoses   Final diagnoses:  Acute cystitis without hematuria    New Prescriptions New Prescriptions   CEPHALEXIN (KEFLEX) 500 MG CAPSULE    Take 1 capsule (500 mg total) by mouth 3 (three) times daily.   FLUCONAZOLE (DIFLUCAN) 200 MG TABLET  Take 1 tablet (200 mg total) by mouth daily.     Maxwell Caul, PA-C 08/26/16 0928    Maxwell Caul, PA-C 08/26/16 1610    Alvira Monday, MD 08/29/16 [phone]

## 2017-01-27 ENCOUNTER — Emergency Department (HOSPITAL_BASED_OUTPATIENT_CLINIC_OR_DEPARTMENT_OTHER)
Admission: EM | Admit: 2017-01-27 | Discharge: 2017-01-28 | Disposition: A | Discharge status: Home / Self Care | Attending: Emergency Medicine | Admitting: Emergency Medicine

## 2017-01-27 ENCOUNTER — Encounter (HOSPITAL_BASED_OUTPATIENT_CLINIC_OR_DEPARTMENT_OTHER): Payer: Self-pay | Admitting: Emergency Medicine

## 2017-01-27 DIAGNOSIS — B372 Candidiasis of skin and nail: Secondary | ICD-10-CM | POA: Diagnosis not present

## 2017-01-27 DIAGNOSIS — Z79899 Other long term (current) drug therapy: Secondary | ICD-10-CM | POA: Insufficient documentation

## 2017-01-27 DIAGNOSIS — R21 Rash and other nonspecific skin eruption: Secondary | ICD-10-CM | POA: Diagnosis present

## 2017-01-27 DIAGNOSIS — E039 Hypothyroidism, unspecified: Secondary | ICD-10-CM | POA: Insufficient documentation

## 2017-01-27 DIAGNOSIS — L2489 Irritant contact dermatitis due to other agents: Secondary | ICD-10-CM | POA: Diagnosis not present

## 2017-01-27 MED ORDER — NYSTATIN-TRIAMCINOLONE 100000-0.1 UNIT/GM-% EX CREA: TOPICAL_CREAM | CUTANEOUS | 0 refills | Status: DC

## 2017-01-27 NOTE — ED Triage Notes (Signed)
Patient states that she ordered new underwear on line and since she has started to wear them she has a rash in her groin

## 2017-01-27 NOTE — ED Provider Notes (Signed)
TIME SEEN: 11:45 PM  CHIEF COMPLAINT: rash  HPI: Pt is a 56 year old female with history of hypothyroidism who presents emergency department with a rash to her groin that started yesterday. States she thinks it is from new and aware that she bought off the Internet. Denies any changes in soaps, lotions, detergents, new medications or other new exposure. States that the area has been very itchy, red and raised. No drainage. No fever, abdominal pain, vomiting, diarrhea.  ROS: See HPI Constitutional: no fever  Eyes: no drainage  ENT: no runny nose   Cardiovascular:  no chest pain  Resp: no SOB  GI: no vomiting GU: no dysuria Integumentary: no rash  Allergy: no hives  Musculoskeletal: no leg swelling  Neurological: no slurred speech ROS otherwise negative  PAST MEDICAL HISTORY/PAST SURGICAL HISTORY:  Past Medical History:  Diagnosis Date  . History of recurrent UTIs   . Thyroid disease     MEDICATIONS:  Prior to Admission medications   Medication Sig Start Date End Date Taking? Authorizing Provider  levothyroxine (SYNTHROID, LEVOTHROID) 50 MCG tablet Take 50 mcg by mouth daily before breakfast.   Yes [provider]  benzonatate (TESSALON) 100 MG capsule Take 1 capsule (100 mg total) by mouth every 8 (eight) hours. 05/31/16   Alvina Chou, PA  ciprofloxacin (CIPRO) 500 MG tablet Take 1 tablet (500 mg total) by mouth 2 (two) times daily. One po bid x 7 days 02/17/16   Fayrene Helper, PA-C  dicyclomine (BENTYL) 20 MG tablet Take 1 tablet (20 mg total) by mouth 2 (two) times daily as needed for spasms. 10/23/14   Loren Racer, MD  fluticasone (FLONASE) 50 MCG/ACT nasal spray Place 2 sprays into both nostrils daily. 05/30/16   Loren Racer, MD  loratadine (CLARITIN) 10 MG tablet Take 1 tablet (10 mg total) by mouth daily. 05/30/16   Loren Racer, MD  Methenamine-Sodium Salicylate (CYSTEX PO) Take 2 tablets by mouth once as needed. For UTI symptoms      [provider]  omeprazole (PRILOSEC) 20 MG capsule Take 1 capsule (20 mg total) by mouth daily. 07/20/15   Horton, Mayer Masker, MD  ondansetron (ZOFRAN ODT) 4 MG disintegrating tablet Take 1 tablet (4 mg total) by mouth every 8 (eight) hours as needed for nausea or vomiting. 07/20/15   Horton, Mayer Masker, MD  polyethylene glycol (MIRALAX / GLYCOLAX) packet Take 17 g by mouth daily. 10/23/14   Loren Racer, MD  tamoxifen (NOLVADEX) 10 MG tablet Take 10 mg by mouth 2 (two) times daily.      [provider]    ALLERGIES:  Allergies  Allergen Reactions  . Estrogens Photosensitivity  . Sulfa Antibiotics Other (See Comments)    Depression     SOCIAL HISTORY:  Social History  Substance Use Topics  . Smoking status: Never Smoker  . Smokeless tobacco: Never Used  . Alcohol use Yes     Comment: socially     FAMILY HISTORY: No family history on file.  EXAM: BP (!) 146/78 (BP Location: Left Arm)   Pulse 88   Temp 97.8 F (36.6 C) (Oral)   Resp 20   Ht  (1.626 m)   Wt 77.1 kg (170 lb)   LMP 07/04/2012   SpO2 100%   BMI 29.18 kg/m  CONSTITUTIONAL: Alert and oriented and responds appropriately to questions. Well-appearing; well-nourished HEAD: Normocephalic EYES: Conjunctivae clear, pupils appear equal, EOMI ENT: normal nose; moist mucous membranes, normal speech, no angioedema NECK:  Supple, no meningismus, no nuchal rigidity, no LAD  CARD: RRR; S1 and S2 appreciated; no murmurs, no clicks, no rubs, no gallops RESP: Normal chest excursion without splinting or tachypnea; breath sounds clear and equal bilaterally; no wheezes, no rhonchi, no rales, no hypoxia or respiratory distress, speaking full sentences ABD/GI: Normal bowel sounds; non-distended; soft, non-tender, no rebound, no guarding, no peritoneal signs, no hepatosplenomegaly BACK:  The back appears normal and is non-tender to palpation, there is no CVA tenderness EXT: Normal ROM in all joints; non-tender to  palpation; no edema; normal capillary refill; no cyanosis, no calf tenderness or swelling    SKIN: Normal color for age and race; warm; patient has a red beefy raised maculopapular rash to the inguinal regions and underneath her pannicula without satellite lesions and no drainage or bleeding, no blisters or desquamation, no petechia or purpura, no hives NEURO: Moves all extremities equally PSYCH: The patient's mood and manner are appropriate. Grooming and personal hygiene are appropriate.  MEDICAL DECISION MAKING: Patient here with what looks like a yeast infection to her scan but also could be contact dermatitis given symptoms started after buying new underwear off the Internet. We'll discharge with nystatin and triamcinolone cream as I feel this will help with both. Recommended Benadryl for itching. No symptoms of systemic reaction. No hives.  No sign of any life-threatening rash. No other complaints today. I feel patient is safe to be discharged. She has a PCP for follow-up as needed. Recommended she not wear these underwear anymore.   At this time, I do not feel there is any life-threatening condition present. I have reviewed and discussed all results (EKG, imaging, lab, urine as appropriate) and exam findings with patient/family. I have reviewed nursing notes and appropriate previous records.  I feel the patient is safe to be discharged home without further emergent workup and can continue workup as an outpatient as needed. Discussed usual and customary return precautions. Patient/family verbalize understanding and are comfortable with this plan.  Outpatient follow-up has been provided if needed. All questions have been answered.      Ward, Layla Maw, DO 01/28/17 [phone]

## 2017-02-18 ENCOUNTER — Emergency Department (HOSPITAL_BASED_OUTPATIENT_CLINIC_OR_DEPARTMENT_OTHER)
Admission: EM | Admit: 2017-02-18 | Discharge: 2017-02-18 | Disposition: A | Discharge status: Home / Self Care | Attending: Emergency Medicine | Admitting: Emergency Medicine

## 2017-02-18 ENCOUNTER — Encounter (HOSPITAL_BASED_OUTPATIENT_CLINIC_OR_DEPARTMENT_OTHER): Payer: Self-pay | Admitting: *Deleted

## 2017-02-18 DIAGNOSIS — R21 Rash and other nonspecific skin eruption: Secondary | ICD-10-CM | POA: Diagnosis present

## 2017-02-18 DIAGNOSIS — L2489 Irritant contact dermatitis due to other agents: Secondary | ICD-10-CM | POA: Diagnosis not present

## 2017-02-18 DIAGNOSIS — Z79899 Other long term (current) drug therapy: Secondary | ICD-10-CM | POA: Insufficient documentation

## 2017-02-18 MED ORDER — NYSTATIN-TRIAMCINOLONE 100000-0.1 UNIT/GM-% EX CREA: TOPICAL_CREAM | CUTANEOUS | 0 refills | Status: AC

## 2017-02-18 NOTE — ED Notes (Signed)
Dr. Horton into room.  

## 2017-02-18 NOTE — ED Notes (Signed)
Alert, NAD, calm, interactive, resps e/u, speaking in clear complete sentences, no dyspnea noted, skin W&D, initial VSS, here for groin rash not better, states, "needs larger tube of medicine, believe I need a longer course", c/o itching and rash, seen here previously for the same, (denies: pain, sob, drainage, urinary sx, fever, back or abd pain, NVD, dizziness or visual changes).

## 2017-02-18 NOTE — ED Provider Notes (Signed)
[facility] EMERGENCY DEPARTMENT Provider Note   CSN: [id number] Arrival date & time: 02/18/17  0100     History   Chief Complaint Chief Complaint  Patient presents with  . Rash    HPI Jorge MandrilCynthia Tramontana is a 56 y.o. female.  HPI  This is a 56 year old female who presents requesting medication refill.  Patient reports that she was a seen and evaluated several weeks ago for a rash.  This was thought to be related to contact dermatitis or possibly yeast.  At that time she had ordered new underwear, washed them in her normal detergent but developed rash in the creases of her groin and upper abdomen consistent with where her underwear offered.  She was given triamcinolone/nystatin ointment.  She states that she got a very small to but try to make it last.  She did have some improvement of the rash but ran out of the ointment.  She tried over-the-counter antifungal and steroid but states that the rash is getting worse again.  She states that it is itchy.  No other new symptoms.  Past Medical History:  Diagnosis Date  . History of recurrent UTIs   . Thyroid disease     There are no active problems to display for this patient.   Past Surgical History:  Procedure Laterality Date  . BIOPSY BREAST    . CESAREAN SECTION  1996    OB History    Gravida Para Term Preterm AB Living   0 0 0 0 0     SAB TAB Ectopic Multiple Live Births   0 0 0           Home Medications    Prior to Admission medications   Medication Sig Start Date End Date Taking? Authorizing Provider  benzonatate (TESSALON) 100 MG capsule Take 1 capsule (100 mg total) by mouth every 8 (eight) hours. 05/31/16   Alvina ChouEspina, Francisco Manuel, PA  ciprofloxacin (CIPRO) 500 MG tablet Take 1 tablet (500 mg total) by mouth 2 (two) times daily. One po bid x 7 days 02/17/16   Fayrene Helperran, Bowie, PA-C  dicyclomine (BENTYL) 20 MG tablet Take 1 tablet (20 mg total) by mouth 2 (two) times daily as needed for spasms. 10/23/14    Loren RacerYelverton, David, MD  fluticasone (FLONASE) 50 MCG/ACT nasal spray Place 2 sprays into both nostrils daily. 05/30/16   Loren RacerYelverton, David, MD  levothyroxine (SYNTHROID, LEVOTHROID) 50 MCG tablet Take 50 mcg by mouth daily before breakfast.    [provider]  loratadine (CLARITIN) 10 MG tablet Take 1 tablet (10 mg total) by mouth daily. 05/30/16   Loren RacerYelverton, David, MD  Methenamine-Sodium Salicylate (CYSTEX PO) Take 2 tablets by mouth once as needed. For UTI symptoms      [provider]  nystatin-triamcinolone (MYCOLOG II) cream Apply to affected area twice daily until resolved 02/18/17   Tyriek Hofman, Mayer Maskerourtney F, MD  omeprazole (PRILOSEC) 20 MG capsule Take 1 capsule (20 mg total) by mouth daily. 07/20/15   Kahlen Boyde, Mayer Maskerourtney F, MD  ondansetron (ZOFRAN ODT) 4 MG disintegrating tablet Take 1 tablet (4 mg total) by mouth every 8 (eight) hours as needed for nausea or vomiting. 07/20/15   Khadeja Abt, Mayer Maskerourtney F, MD  polyethylene glycol (MIRALAX / GLYCOLAX) packet Take 17 g by mouth daily. 10/23/14   Loren RacerYelverton, David, MD  tamoxifen (NOLVADEX) 10 MG tablet Take 10 mg by mouth 2 (two) times daily.      [provider]    Family History No family  history on file.  Social History Social History   Tobacco Use  . Smoking status: Never Smoker  . Smokeless tobacco: Never Used  Substance Use Topics  . Alcohol use: Yes    Comment: socially   . Drug use: No     Allergies   Estrogens and Sulfa antibiotics   Review of Systems Review of Systems  Skin: Positive for rash.  All other systems reviewed and are negative.    Physical Exam Updated Vital Signs BP (!) 145/71 (BP Location: Left Arm)   Pulse 96   Temp 98 F (36.7 C) (Oral)   Resp 18   LMP 07/04/2012   SpO2 100%   Physical Exam  Constitutional: She is oriented to person, place, and time. She appears well-developed and well-nourished.  HENT:  Head: Normocephalic and atraumatic.  Cardiovascular: Normal rate and regular  rhythm.  Pulmonary/Chest: Effort normal. No respiratory distress.  Neurological: She is alert and oriented to person, place, and time.  Skin: Skin is warm and dry.  Hyper pigmentation mild erythema noted just below the pannus and within the bilateral groin  Psychiatric: She has a normal mood and affect.  Nursing note and vitals reviewed.    ED Treatments / Results  Labs (all labs ordered are listed, but only abnormal results are displayed) Labs Reviewed - No data to display  EKG  EKG Interpretation None       Radiology No results found.  Procedures Procedures (including critical care time)  Medications Ordered in ED Medications - No data to display   Initial Impression / Assessment and Plan / ED Course  I have reviewed the triage vital signs and the nursing notes.  Pertinent labs & imaging results that were available during my care of the patient were reviewed by me and considered in my medical decision making (see chart for details).     She presents with persistent rash.  Reports improvement with prior cream but worsening after stopping.  Nontoxic on exam.  Exam is consistent with likely contact dermatitis.  Given improvement with triamcinolone/nystatin, will refill with a larger tube.  After history, exam, and medical workup I feel the patient has been appropriately medically screened and is safe for discharge home. Pertinent diagnoses were discussed with the patient. Patient was given return precautions.   Final Clinical Impressions(s) / ED Diagnoses   Final diagnoses:  Irritant contact dermatitis due to other agents    ED Discharge Orders        Ordered    nystatin-triamcinolone (MYCOLOG II) cream     02/18/17 0132       Shon Baton, MD 02/18/17 [phone]

## 2017-02-18 NOTE — ED Triage Notes (Signed)
Pt reports worsening rash in the groin area. Pt states that she developed a rash from underwear, she was seen here for the same and given prescrip for treatment. She says she ran out of the medication before the rash went away. She tried an OTC cream yesterday, but it made it worse.

## 2017-03-12 ENCOUNTER — Emergency Department (HOSPITAL_BASED_OUTPATIENT_CLINIC_OR_DEPARTMENT_OTHER)
Admission: EM | Admit: 2017-03-12 | Discharge: 2017-03-12 | Disposition: A | Discharge status: Home / Self Care | Attending: Physician Assistant | Admitting: Physician Assistant

## 2017-03-12 ENCOUNTER — Other Ambulatory Visit: Payer: Self-pay

## 2017-03-12 ENCOUNTER — Encounter (HOSPITAL_BASED_OUTPATIENT_CLINIC_OR_DEPARTMENT_OTHER): Payer: Self-pay | Admitting: *Deleted

## 2017-03-12 DIAGNOSIS — N3001 Acute cystitis with hematuria: Secondary | ICD-10-CM

## 2017-03-12 DIAGNOSIS — E039 Hypothyroidism, unspecified: Secondary | ICD-10-CM | POA: Insufficient documentation

## 2017-03-12 DIAGNOSIS — R300 Dysuria: Secondary | ICD-10-CM | POA: Diagnosis present

## 2017-03-12 DIAGNOSIS — B9620 Unspecified Escherichia coli [E. coli] as the cause of diseases classified elsewhere: Secondary | ICD-10-CM | POA: Diagnosis not present

## 2017-03-12 DIAGNOSIS — Z79899 Other long term (current) drug therapy: Secondary | ICD-10-CM | POA: Diagnosis not present

## 2017-03-12 LAB — URINALYSIS, ROUTINE W REFLEX MICROSCOPIC
Bilirubin Urine: NEGATIVE
Glucose, UA: NEGATIVE mg/dL
Ketones, ur: NEGATIVE mg/dL
Nitrite: POSITIVE — AB
PH: 6 (ref 5.0–8.0)
Protein, ur: NEGATIVE mg/dL

## 2017-03-12 LAB — URINALYSIS, MICROSCOPIC (REFLEX)

## 2017-03-12 MED ORDER — CEPHALEXIN 500 MG PO CAPS: 500.0000 mg | ORAL_CAPSULE | Freq: Two times a day (BID) | ORAL | 0 refills | Status: AC

## 2017-03-12 MED ORDER — FLUCONAZOLE 200 MG PO TABS: 200.0000 mg | ORAL_TABLET | Freq: Every day | ORAL | 0 refills | Status: AC

## 2017-03-12 MED FILL — CEPHALEXIN 500 MG CAPSULE: 500 | 7 days supply | Qty: 14 | Fill #0

## 2017-03-12 NOTE — Discharge Instructions (Signed)
As discussed, please take your entire course of antibiotics even if you feel better. Drink plenty of water to keep your urine clear.  Follow up with your primary care provider. Return if worsening symptoms, fever, chills, back pain, nausea, vomiting or other new concerning symptoms in the meantime.

## 2017-03-12 NOTE — ED Provider Notes (Signed)
[facility] EMERGENCY DEPARTMENT Provider Note   CSN: [id number] Arrival date & time: 03/12/17  1355     History   Chief Complaint Chief Complaint  Patient presents with  . Dysuria    HPI Yolanda Carr is a 56 y.o. female with history significant for recurrent UTIs presenting with sudden onset lower back discomfort and urgency in the middle of the night which is unusual for her. She also noted a strong odor to her urine and started having dysuria and some chills today. No fever, nausea/vomiting or back pain otherwise.  HPI  Past Medical History:  Diagnosis Date  . History of recurrent UTIs   . Thyroid disease     There are no active problems to display for this patient.   Past Surgical History:  Procedure Laterality Date  . BIOPSY BREAST    . CESAREAN SECTION  1996    OB History    Gravida Para Term Preterm AB Living   0 0 0 0 0     SAB TAB Ectopic Multiple Live Births   0 0 0           Home Medications    Prior to Admission medications   Medication Sig Start Date End Date Taking? Authorizing Provider  benzonatate (TESSALON) 100 MG capsule Take 1 capsule (100 mg total) by mouth every 8 (eight) hours. 05/31/16   Espina, Lucita Lora, PA  cephALEXin (KEFLEX) 500 MG capsule Take 1 capsule (500 mg total) by mouth 2 (two) times daily for 7 days. 03/12/17 03/19/17  Mathews Robinsons B, PA-C  ciprofloxacin (CIPRO) 500 MG tablet Take 1 tablet (500 mg total) by mouth 2 (two) times daily. One po bid x 7 days 02/17/16   Fayrene Helper, PA-C  dicyclomine (BENTYL) 20 MG tablet Take 1 tablet (20 mg total) by mouth 2 (two) times daily as needed for spasms. 10/23/14   Loren Racer, MD  fluconazole (DIFLUCAN) 200 MG tablet Take 1 tablet (200 mg total) by mouth daily for 7 days. 03/12/17 03/19/17  Mathews Robinsons B, PA-C  fluticasone (FLONASE) 50 MCG/ACT nasal spray Place 2 sprays into both nostrils daily. 05/30/16   Loren Racer, MD  levothyroxine (SYNTHROID,  LEVOTHROID) 50 MCG tablet Take 50 mcg by mouth daily before breakfast.    [provider]  loratadine (CLARITIN) 10 MG tablet Take 1 tablet (10 mg total) by mouth daily. 05/30/16   Loren Racer, MD  Methenamine-Sodium Salicylate (CYSTEX PO) Take 2 tablets by mouth once as needed. For UTI symptoms      [provider]  nystatin-triamcinolone (MYCOLOG II) cream Apply to affected area twice daily until resolved 02/18/17   Horton, Mayer Masker, MD  omeprazole (PRILOSEC) 20 MG capsule Take 1 capsule (20 mg total) by mouth daily. 07/20/15   Horton, Mayer Masker, MD  ondansetron (ZOFRAN ODT) 4 MG disintegrating tablet Take 1 tablet (4 mg total) by mouth every 8 (eight) hours as needed for nausea or vomiting. 07/20/15   Horton, Mayer Masker, MD  polyethylene glycol (MIRALAX / GLYCOLAX) packet Take 17 g by mouth daily. 10/23/14   Loren Racer, MD  tamoxifen (NOLVADEX) 10 MG tablet Take 10 mg by mouth 2 (two) times daily.      [provider]    Family History History reviewed. No pertinent family history.  Social History Social History   Tobacco Use  . Smoking status: Never Smoker  . Smokeless tobacco: Never Used  Substance Use Topics  . Alcohol use: Yes  Comment: socially   . Drug use: No     Allergies   Estrogens and Sulfa antibiotics   Review of Systems Review of Systems  Constitutional: Positive for chills. Negative for fever.  Gastrointestinal: Negative for nausea and vomiting.  Genitourinary: Positive for dysuria, frequency and urgency. Negative for difficulty urinating, flank pain, hematuria, pelvic pain, vaginal discharge and vaginal pain.  Musculoskeletal: Positive for back pain.  Skin: Negative for color change, pallor and rash.  Neurological: Negative for dizziness and light-headedness.     Physical Exam Updated Vital Signs BP 125/81   Pulse 78   Temp 98 F (36.7 C) (Oral)   Resp 16   Ht 5\' 4"  (1.626 m)   Wt 77.1 kg (170 lb)   LMP 07/04/2012    SpO2 98%   BMI 29.18 kg/m   Physical Exam  Constitutional: She appears well-developed and well-nourished. No distress.  Afebrile, nontoxic appearing sitting comfortably in chair in no acute distress.  HENT:  Head: Normocephalic and atraumatic.  Eyes: Conjunctivae are normal.  Neck: Neck supple.  Cardiovascular: Normal rate, regular rhythm and normal heart sounds.  No murmur heard. Pulmonary/Chest: Effort normal and breath sounds normal. No stridor. No respiratory distress. She has no wheezes. She has no rales.  Abdominal: Soft. She exhibits no distension. There is no tenderness.  No cva tenderness  Musculoskeletal: Normal range of motion. She exhibits no edema.  Neurological: She is alert.  Skin: Skin is warm and dry. No rash noted. She is not diaphoretic. No erythema. No pallor.  Psychiatric: She has a normal mood and affect.  Nursing note and vitals reviewed.    ED Treatments / Results  Labs (all labs ordered are listed, but only abnormal results are displayed) Labs Reviewed  URINALYSIS, ROUTINE W REFLEX MICROSCOPIC - Abnormal; Notable for the following components:      Result Value   APPearance CLOUDY (*)    Specific Gravity, Urine >1.030 (*)    Hgb urine dipstick MODERATE (*)    Nitrite POSITIVE (*)    Leukocytes, UA TRACE (*)    All other components within normal limits  URINALYSIS, MICROSCOPIC (REFLEX) - Abnormal; Notable for the following components:   Bacteria, UA MANY (*)    Squamous Epithelial / LPF 0-5 (*)    All other components within normal limits  URINE CULTURE    EKG  EKG Interpretation None       Radiology No results found.  Procedures Procedures (including critical care time)  Medications Ordered in ED Medications - No data to display   Initial Impression / Assessment and Plan / ED Course  I have reviewed the triage vital signs and the nursing notes.  Pertinent labs & imaging results that were available during my care of the patient  were reviewed by me and considered in my medical decision making (see chart for details).    Patient presenting with symptoms consistent with UTI. UA showing signs of infection. Patient is well-appearing, nontoxic and afebrile without CVA tenderness. Exam not concerning for pyelonephritis.  Patient reports that she typically gets a yeast infection every time she is given antibiotics for her UTIs. Will send home with Diflucan  Reviewed past cultures for sensitivity  Discharge home with Keflex and Diflucan PCP follow-up as needed. advised hydration.  Discussed strict return precautions and advised to return to the emergency department if experiencing any new or worsening symptoms. Instructions were understood and patient agreed with discharge plan.  Final Clinical Impressions(s) / ED Diagnoses  Final diagnoses:  Acute cystitis with hematuria    ED Discharge Orders        Ordered    cephALEXin (KEFLEX) 500 MG capsule  2 times daily     03/12/17 1508    fluconazole (DIFLUCAN) 200 MG tablet  Daily     03/12/17 1508       Gregary CromerMitchell, Jessica B, PA-C 03/12/17 1521    Abelino DerrickMackuen, Courteney Lyn, MD 03/12/17 1527

## 2017-03-12 NOTE — ED Triage Notes (Signed)
Pt c/o freq painful urination x 1 day.  

## 2017-03-14 LAB — URINE CULTURE

## 2017-03-15 ENCOUNTER — Telehealth: Payer: Self-pay | Admitting: *Deleted

## 2017-03-15 NOTE — Telephone Encounter (Signed)
Post ED Visit - Positive Culture Follow-up  Culture report reviewed by antimicrobial stewardship pharmacist:  []  Enzo BiNathan Batchelder, Pharm.D. []  Celedonio MiyamotoJeremy Frens, Pharm.D., BCPS AQ-ID []  Garvin FilaMike Maccia, Pharm.D., BCPS []  Georgina PillionElizabeth Martin, Pharm.D., BCPS []  [place]-HudsonMinh Pham, 17[address] Rainbow BoulevardPharm.D., BCPS, AAHIVP []  Estella HuskMichelle Turner, Pharm.D., BCPS, AAHIVP []  Lysle Pearlachel Rumbarger, PharmD, BCPS []  Casilda Carlsaylor Stone, PharmD, BCPS []  Pollyann SamplesAndy Johnston, PharmD, BCPS Dellie Catholiciana Raymond, PharmD  Positive urine culture Treated with Cephalexin, organism sensitive to the same and no further patient follow-up is required at this time.  Virl AxeRobertson, Raymel Cull [facility] 03/15/2017, 11:42 AM

## 2017-03-19 MED FILL — FLUCONAZOLE 200 MG TABLET: 200 | 7 days supply | Qty: 7 | Fill #0

## 2017-03-21 ENCOUNTER — Emergency Department (HOSPITAL_BASED_OUTPATIENT_CLINIC_OR_DEPARTMENT_OTHER)
Admission: EM | Admit: 2017-03-21 | Discharge: 2017-03-21 | Disposition: A | Discharge status: Home / Self Care | Attending: Emergency Medicine | Admitting: Emergency Medicine

## 2017-03-21 ENCOUNTER — Encounter (HOSPITAL_BASED_OUTPATIENT_CLINIC_OR_DEPARTMENT_OTHER): Payer: Self-pay

## 2017-03-21 ENCOUNTER — Other Ambulatory Visit: Payer: Self-pay

## 2017-03-21 DIAGNOSIS — Z79899 Other long term (current) drug therapy: Secondary | ICD-10-CM | POA: Diagnosis not present

## 2017-03-21 DIAGNOSIS — F17200 Nicotine dependence, unspecified, uncomplicated: Secondary | ICD-10-CM | POA: Insufficient documentation

## 2017-03-21 DIAGNOSIS — R300 Dysuria: Secondary | ICD-10-CM | POA: Insufficient documentation

## 2017-03-21 DIAGNOSIS — R81 Glycosuria: Secondary | ICD-10-CM | POA: Diagnosis not present

## 2017-03-21 LAB — URINALYSIS, ROUTINE W REFLEX MICROSCOPIC
Bilirubin Urine: NEGATIVE
HGB URINE DIPSTICK: NEGATIVE
KETONES UR: NEGATIVE mg/dL
LEUKOCYTES UA: NEGATIVE
Nitrite: NEGATIVE
PH: 5.5 (ref 5.0–8.0)
Protein, ur: NEGATIVE mg/dL
Specific Gravity, Urine: >1.03 — ABNORMAL HIGH (ref 1.005–1.030)

## 2017-03-21 LAB — URINALYSIS, MICROSCOPIC (REFLEX): WBC UA: NONE SEEN WBC/hpf (ref 0–5)

## 2017-03-21 MED ORDER — NAPROXEN 500 MG PO TABS: 500.0000 mg | ORAL_TABLET | Freq: Two times a day (BID) | ORAL | 0 refills | Status: AC

## 2017-03-21 NOTE — Discharge Instructions (Signed)
Take naproxen twice a day with meals to help with pain and swelling. It is very important that you stay well-hydrated with water. Try to decrease your caffeine intake, as this will help with your symptoms. It is important that you follow-up with your primary care doctor for further evaluation of your blood sugar and urinary symptoms. Return to the emergency room if you develop fevers, chills, vomiting, or any new or worsening symptoms.

## 2017-03-21 NOTE — ED Triage Notes (Signed)
C/o cont'd dysuria and foul smelling urine-completed keflex 2 days ago for UTI-NAD-steady gait

## 2017-03-21 NOTE — ED Notes (Signed)
ED Provider at bedside. 

## 2017-03-21 NOTE — ED Provider Notes (Signed)
[facility] EMERGENCY DEPARTMENT Provider Note   CSN: [id number] Arrival date & time: 03/21/17  1807     History   Chief Complaint Chief Complaint  Patient presents with  . Dysuria    HPI Yolanda Carr is a 56 y.o. female presenting with urinary symptoms.  Patient states that she started to have urinary symptoms approximately 5 days ago.  She had dysuria, urinary frequency and suprapubic pressure.  She was diagnosed with a UTI, placed on Keflex, and culture was sent.  Patient states symptoms improved slightly with 3 days of antibiotics, but did not completely resolved.  Over the past 2 days, she has had return of dysuria and increasing urinary frequency.  She denies fevers, chills, chest pain, shortness of breath, nausea, vomiting, abdominal pain, flank pain, or abnormal bowel movements.  She states she has had frequent UTIs since menopause.  She denies vaginal discharge.   HPI  Past Medical History:  Diagnosis Date  . History of recurrent UTIs   . Thyroid disease     There are no active problems to display for this patient.   Past Surgical History:  Procedure Laterality Date  . BIOPSY BREAST    . CESAREAN SECTION  1996    OB History    Gravida Para Term Preterm AB Living   0 0 0 0 0     SAB TAB Ectopic Multiple Live Births   0 0 0           Home Medications    Prior to Admission medications   Medication Sig Start Date End Date Taking? Authorizing Provider  benzonatate (TESSALON) 100 MG capsule Take 1 capsule (100 mg total) by mouth every 8 (eight) hours. 05/31/16   Alvina ChouEspina, Francisco Manuel, PA  ciprofloxacin (CIPRO) 500 MG tablet Take 1 tablet (500 mg total) by mouth 2 (two) times daily. One po bid x 7 days 02/17/16   Fayrene Helperran, Bowie, PA-C  dicyclomine (BENTYL) 20 MG tablet Take 1 tablet (20 mg total) by mouth 2 (two) times daily as needed for spasms. 10/23/14   Loren RacerYelverton, David, MD  fluticasone (FLONASE) 50 MCG/ACT nasal spray Place 2 sprays into both  nostrils daily. 05/30/16   Loren RacerYelverton, David, MD  levothyroxine (SYNTHROID, LEVOTHROID) 50 MCG tablet Take 50 mcg by mouth daily before breakfast.    [provider]  loratadine (CLARITIN) 10 MG tablet Take 1 tablet (10 mg total) by mouth daily. 05/30/16   Loren RacerYelverton, David, MD  Methenamine-Sodium Salicylate (CYSTEX PO) Take 2 tablets by mouth once as needed. For UTI symptoms      [provider]  naproxen (NAPROSYN) 500 MG tablet Take 1 tablet (500 mg total) by mouth 2 (two) times daily with a meal. 03/21/17   Linzie Criss, PA-C  nystatin-triamcinolone (MYCOLOG II) cream Apply to affected area twice daily until resolved 02/18/17   Horton, Mayer Maskerourtney F, MD  omeprazole (PRILOSEC) 20 MG capsule Take 1 capsule (20 mg total) by mouth daily. 07/20/15   Horton, Mayer Maskerourtney F, MD  ondansetron (ZOFRAN ODT) 4 MG disintegrating tablet Take 1 tablet (4 mg total) by mouth every 8 (eight) hours as needed for nausea or vomiting. 07/20/15   Horton, Mayer Maskerourtney F, MD  polyethylene glycol (MIRALAX / GLYCOLAX) packet Take 17 g by mouth daily. 10/23/14   Loren RacerYelverton, David, MD  tamoxifen (NOLVADEX) 10 MG tablet Take 10 mg by mouth 2 (two) times daily.      [provider]    Family History No family history on  file.  Social History Social History   Tobacco Use  . Smoking status: Current Some Day Smoker  . Smokeless tobacco: Never Used  Substance Use Topics  . Alcohol use: Yes    Comment: socially   . Drug use: No     Allergies   Estrogens and Sulfa antibiotics   Review of Systems Review of Systems  Constitutional: Negative for chills and fever.  Gastrointestinal: Negative for nausea and vomiting.  Genitourinary: Positive for dysuria, frequency and urgency. Negative for flank pain.     Physical Exam Updated Vital Signs BP 115/78 (BP Location: Right Arm)   Pulse 90   Temp 98.1 F (36.7 C) (Oral)   Resp 16   Ht 5\' 4"  (1.626 m)   Wt 85.7 kg (189 lb)   LMP 07/04/2012   SpO2 97%    BMI 32.44 kg/m   Physical Exam  Constitutional: She is oriented to person, place, and time. She appears well-developed and well-nourished. No distress.  HENT:  Head: Normocephalic and atraumatic.  Eyes: EOM are normal.  Neck: Normal range of motion.  Cardiovascular: Normal rate, regular rhythm and intact distal pulses.  Pulmonary/Chest: Effort normal and breath sounds normal. No respiratory distress. She has no wheezes.  Abdominal: Soft. She exhibits no distension and no mass. There is no tenderness. There is no rebound and no guarding.  No tenderness palpation of the abdomen.  No suprapubic pain.  No CVA tenderness or flank pain.  Musculoskeletal: Normal range of motion.  Neurological: She is alert and oriented to person, place, and time.  Skin: Skin is warm. No rash noted.  Psychiatric: She has a normal mood and affect.  Nursing note and vitals reviewed.    ED Treatments / Results  Labs (all labs ordered are listed, but only abnormal results are displayed) Labs Reviewed  URINALYSIS, ROUTINE W REFLEX MICROSCOPIC - Abnormal; Notable for the following components:      Result Value   Specific Gravity, Urine >1.030 (*)    Glucose, UA >=500 (*)    All other components within normal limits  URINALYSIS, MICROSCOPIC (REFLEX) - Abnormal; Notable for the following components:   Bacteria, UA RARE (*)    Squamous Epithelial / LPF 0-5 (*)    All other components within normal limits  URINE CULTURE    EKG  EKG Interpretation None       Radiology No results found.  Procedures Procedures (including critical care time)  Medications Ordered in ED Medications - No data to display   Initial Impression / Assessment and Plan / ED Course  I have reviewed the triage vital signs and the nursing notes.  Pertinent labs & imaging results that were available during my care of the patient were reviewed by me and considered in my medical decision making (see chart for details).      Patient presenting with urinary symptoms.  Physical exam reassuring, she is afebrile not tachycardic.  No CVA tenderness or flank pain.  Doubt pyelo.  Will obtain urine for further evaluation.  Urine shows elevated glucose, no signs of infection.  On chart review, patient has had elevated glucose in her urine and blood sugar of 287 a year ago.  Discussed findings with patient.  I suggested blood work and IV fluids, but patient refused.  Discussed that she may have elevated blood sugars right now which are causing her symptoms and could cause kidney damage.  Patient states she does not want blood work done today.  She will  follow-up with her primary care for further evaluation.  Stressed the importance of hydration.  Patient is requesting ciprofloxacin for urinary symptoms.  I told her that at this time, there is no evidence of infection, and I do not believe antibiotics will help.  Urine was sent for culture, and patient will receive a phone call if infection grows out.  Offered Pyridium, but patient states this does not help her symptoms.  Will have patient take anti-inflammatory for irritation, and follow-up with primary.  At this time, patient appears safe for discharge.  Return precautions given.  Patient states she understands and agrees to plan.   Final Clinical Impressions(s) / ED Diagnoses   Final diagnoses:  Dysuria  Glucose found in urine on examination    ED Discharge Orders        Ordered    naproxen (NAPROSYN) 500 MG tablet  2 times daily with meals     03/21/17 2016       Alveria ApleyCaccavale, Dovey Fatzinger, PA-C 03/22/17 0034    Maia PlanLong, Joshua G, MD 03/22/17 1026

## 2017-03-23 LAB — URINE CULTURE: Culture: NO GROWTH

## 2017-05-23 ENCOUNTER — Encounter (HOSPITAL_BASED_OUTPATIENT_CLINIC_OR_DEPARTMENT_OTHER): Payer: Self-pay | Admitting: *Deleted

## 2017-05-23 ENCOUNTER — Other Ambulatory Visit: Payer: Self-pay

## 2017-05-23 ENCOUNTER — Emergency Department (HOSPITAL_BASED_OUTPATIENT_CLINIC_OR_DEPARTMENT_OTHER)
Admission: EM | Admit: 2017-05-23 | Discharge: 2017-05-23 | Disposition: A | Discharge status: Home / Self Care | Attending: Emergency Medicine | Admitting: Emergency Medicine

## 2017-05-23 DIAGNOSIS — N3000 Acute cystitis without hematuria: Secondary | ICD-10-CM | POA: Diagnosis not present

## 2017-05-23 DIAGNOSIS — R300 Dysuria: Secondary | ICD-10-CM | POA: Diagnosis present

## 2017-05-23 DIAGNOSIS — F17200 Nicotine dependence, unspecified, uncomplicated: Secondary | ICD-10-CM | POA: Diagnosis not present

## 2017-05-23 DIAGNOSIS — Z79899 Other long term (current) drug therapy: Secondary | ICD-10-CM | POA: Insufficient documentation

## 2017-05-23 LAB — URINALYSIS, ROUTINE W REFLEX MICROSCOPIC
BILIRUBIN URINE: NEGATIVE
Glucose, UA: >=500 mg/dL — AB
Ketones, ur: 15 mg/dL — AB
Leukocytes, UA: NEGATIVE
NITRITE: NEGATIVE
Protein, ur: NEGATIVE mg/dL
Specific Gravity, Urine: >1.03 — ABNORMAL HIGH (ref 1.005–1.030)
pH: 6 (ref 5.0–8.0)

## 2017-05-23 LAB — URINALYSIS, MICROSCOPIC (REFLEX)

## 2017-05-23 MED ORDER — FLUCONAZOLE 150 MG PO TABS: 150.0000 mg | ORAL_TABLET | Freq: Once | ORAL | 0 refills | Status: AC

## 2017-05-23 MED ORDER — CEPHALEXIN 500 MG PO CAPS: 500.0000 mg | ORAL_CAPSULE | Freq: Four times a day (QID) | ORAL | 0 refills | Status: AC

## 2017-05-23 MED FILL — CEPHALEXIN 500 MG CAPSULE: 500 | 7 days supply | Qty: 28 | Fill #0

## 2017-05-23 MED FILL — FLUCONAZOLE 150 MG TABS: 150 | 1 days supply | Qty: 1 | Fill #0

## 2017-05-23 NOTE — Discharge Instructions (Signed)
1. Medications: Kelfex, usual home medications Please take all of your antibiotics until finished!   You may develop abdominal discomfort or diarrhea from the antibiotic.  You may help offset this with probiotics which you can buy or get in yogurt. Do not eat  or take the probiotics until 2 hours after your antibiotic.  2. Treatment: rest, drink plenty of fluids, take medications as prescribed 3. Follow Up: Please followup with your primary doctor in 3 days for discussion of your diagnoses and further evaluation after today's visit; if you do not have a primary care doctor use the resource guide provided to find one; return to the ER for fevers, persistent vomiting, worsening abdominal pain/back pain or other concerning symptoms.

## 2017-05-23 NOTE — ED Triage Notes (Signed)
Dysuria this am. Hx of UTI.

## 2017-05-23 NOTE — ED Provider Notes (Signed)
[facility] EMERGENCY DEPARTMENT Provider Note   CSN: [id number] Arrival date & time: 05/23/17  1430     History   Chief Complaint Chief Complaint  Patient presents with  . Dysuria    HPI Yolanda MandrilCynthia Simonin is a 57 y.o. female with history of recurrent UTIs and thyroid disease presents today for chief complaint acute onset of dysuria and urgency.  States her symptoms began this morning.  States this feels exactly like the last time she had a UTI and states that she has been getting them recurrently since she underwent menopause at the age of 57.  She denies hematuria, abdominal pain, nausea, vomiting, or diarrhea.  Denies vaginal itching, bleeding, or discharge.  She has not tried anything for her symptoms.  The history is provided by the patient.    Past Medical History:  Diagnosis Date  . History of recurrent UTIs   . Thyroid disease     There are no active problems to display for this patient.   Past Surgical History:  Procedure Laterality Date  . BIOPSY BREAST    . CESAREAN SECTION  1996    OB History    Gravida Para Term Preterm AB Living   0 0 0 0 0     SAB TAB Ectopic Multiple Live Births   0 0 0           Home Medications    Prior to Admission medications   Medication Sig Start Date End Date Taking? Authorizing Provider  benzonatate (TESSALON) 100 MG capsule Take 1 capsule (100 mg total) by mouth every 8 (eight) hours. 05/31/16   Espina, Lucita LoraFrancisco Manuel, PA  cephALEXin (KEFLEX) 500 MG capsule Take 1 capsule (500 mg total) by mouth 4 (four) times daily for 7 days. 05/23/17 05/30/17  Michela PitcherFawze, Teigen Bellin A, PA-C  ciprofloxacin (CIPRO) 500 MG tablet Take 1 tablet (500 mg total) by mouth 2 (two) times daily. One po bid x 7 days 02/17/16   Fayrene Helperran, Bowie, PA-C  dicyclomine (BENTYL) 20 MG tablet Take 1 tablet (20 mg total) by mouth 2 (two) times daily as needed for spasms. 10/23/14   Loren RacerYelverton, David, MD  fluconazole (DIFLUCAN) 150 MG tablet Take 1 tablet (150 mg total)  by mouth once for 1 dose. Take only if you develop yeast infection symptoms 05/23/17 05/23/17  Michela PitcherFawze, Breeona Waid A, PA-C  fluticasone (FLONASE) 50 MCG/ACT nasal spray Place 2 sprays into both nostrils daily. 05/30/16   Loren RacerYelverton, David, MD  levothyroxine (SYNTHROID, LEVOTHROID) 50 MCG tablet Take 50 mcg by mouth daily before breakfast.    [provider]  loratadine (CLARITIN) 10 MG tablet Take 1 tablet (10 mg total) by mouth daily. 05/30/16   Loren RacerYelverton, David, MD  Methenamine-Sodium Salicylate (CYSTEX PO) Take 2 tablets by mouth once as needed. For UTI symptoms      [provider]  naproxen (NAPROSYN) 500 MG tablet Take 1 tablet (500 mg total) by mouth 2 (two) times daily with a meal. 03/21/17   Caccavale, Sophia, PA-C  nystatin-triamcinolone (MYCOLOG II) cream Apply to affected area twice daily until resolved 02/18/17   Horton, Mayer Maskerourtney F, MD  omeprazole (PRILOSEC) 20 MG capsule Take 1 capsule (20 mg total) by mouth daily. 07/20/15   Horton, Mayer Maskerourtney F, MD  ondansetron (ZOFRAN ODT) 4 MG disintegrating tablet Take 1 tablet (4 mg total) by mouth every 8 (eight) hours as needed for nausea or vomiting. 07/20/15   Horton, Mayer Maskerourtney F, MD  polyethylene glycol (MIRALAX / Ethelene HalGLYCOLAX) packet Take  17 g by mouth daily. 10/23/14   Loren Racer, MD  tamoxifen (NOLVADEX) 10 MG tablet Take 10 mg by mouth 2 (two) times daily.      [provider]    Family History No family history on file.  Social History Social History   Tobacco Use  . Smoking status: Current Some Day Smoker  . Smokeless tobacco: Never Used  Substance Use Topics  . Alcohol use: Yes    Comment: socially   . Drug use: No     Allergies   Estrogens and Sulfa antibiotics   Review of Systems Review of Systems  Constitutional: Negative for chills and fever.  Gastrointestinal: Negative for abdominal pain, blood in stool, constipation, diarrhea, nausea and vomiting.  Genitourinary: Positive for dysuria, frequency and  urgency. Negative for flank pain, hematuria, vaginal bleeding, vaginal discharge and vaginal pain.  Musculoskeletal: Negative for back pain.     Physical Exam Updated Vital Signs BP 123/67 (BP Location: Right Arm)   Pulse 74   Temp 98.6 F (37 C) (Oral)   Resp 18   Ht 5\' 5"  (1.651 m)   Wt 85.7 kg (189 lb)   LMP 07/04/2012   SpO2 98%   BMI 31.45 kg/m   Physical Exam  Constitutional: She appears well-developed and well-nourished. No distress.  HENT:  Head: Normocephalic and atraumatic.  Eyes: Conjunctivae are normal. Right eye exhibits no discharge. Left eye exhibits no discharge.  Neck: No JVD present. No tracheal deviation present.  Cardiovascular: Normal rate, regular rhythm and normal heart sounds.  Pulmonary/Chest: Effort normal and breath sounds normal.  Abdominal: Soft. Bowel sounds are normal. She exhibits no distension. There is no tenderness. There is no guarding.  No CVA tenderness or suprapubic tenderness  Musculoskeletal: She exhibits no edema.  Neurological: She is alert.  Skin: Skin is warm and dry. No erythema.  Psychiatric: She has a normal mood and affect. Her behavior is normal.  Nursing note and vitals reviewed.    ED Treatments / Results  Labs (all labs ordered are listed, but only abnormal results are displayed) Labs Reviewed  URINALYSIS, ROUTINE W REFLEX MICROSCOPIC - Abnormal; Notable for the following components:      Result Value   Specific Gravity, Urine >1.030 (*)    Glucose, UA >=500 (*)    Hgb urine dipstick MODERATE (*)    Ketones, ur 15 (*)    All other components within normal limits  URINALYSIS, MICROSCOPIC (REFLEX) - Abnormal; Notable for the following components:   Bacteria, UA FEW (*)    Squamous Epithelial / LPF 0-5 (*)    All other components within normal limits  URINE CULTURE    EKG  EKG Interpretation None       Radiology No results found.  Procedures Procedures (including critical care time)  Medications  Ordered in ED Medications - No data to display   Initial Impression / Assessment and Plan / ED Course  I have reviewed the triage vital signs and the nursing notes.  Pertinent labs & imaging results that were available during my care of the patient were reviewed by me and considered in my medical decision making (see chart for details).    Patient with urinary symptoms which began today consistent with her usual UTI symptoms.  Afebrile, vital signs are stable.  no CVA tenderness and abdomen is entirely benign.  UA consistent with UTI, cultured.  Previous cultures have been susceptible to Keflex.  Pt is afebrile, no CVA tenderness, normotensive,  and denies N/V.  No vaginal complaints.  Pt to be dc home with antibiotics and instructions to follow up with PCP if symptoms persist.  Discussed indications for return to the ED. Pt verbalized understanding of and agreement with plan and is safe for discharge home at this time.    Final Clinical Impressions(s) / ED Diagnoses   Final diagnoses:  Acute cystitis without hematuria    ED Discharge Orders        Ordered    cephALEXin (KEFLEX) 500 MG capsule  4 times daily     05/23/17 1615    fluconazole (DIFLUCAN) 150 MG tablet   Once     05/23/17 1615       Jeanie Sewer, PA-C 05/23/17 1634    Benjiman Core, MD 05/24/17 [phone]

## 2017-05-26 LAB — URINE CULTURE: Culture: 30000 — AB

## 2017-05-27 ENCOUNTER — Telehealth: Payer: Self-pay | Admitting: Emergency Medicine

## 2017-05-27 NOTE — Telephone Encounter (Signed)
Post ED Visit - Positive Culture Follow-up: Successful Patient Follow-Up  Culture assessed and recommendations reviewed by: []  Enzo BiNathan Batchelder, Pharm.D. []  Celedonio MiyamotoJeremy Frens, Pharm.D., BCPS AQ-ID []  Garvin FilaMike Maccia, Pharm.D., BCPS [x]  Georgina PillionElizabeth Martin, [address]Pharm.D., BCPS []  [place]h Pham, [place]arm.D., BCPS, AAHIVP []  Estella HuskMichelle Turner, Pharm.D., BCPS, AAHIVP []  Lysle Pearlachel Rumbarger, PharmD, BCPS []  Casilda Carlsaylor Stone, PharmD, BCPS []  Pollyann SamplesAndy Johnston, PharmD, BCPS  Positive urine culture 30,000 colonie count  []  Patient discharged without antimicrobial prescription and treatment is now indicated []  Organism is resistant to prescribed ED discharge antimicrobial []  Patient with positive blood cultures  Changes discussed with ED provider: Alveria ApleySophia Caccavale PA New antibiotic prescription stop cephalexin, if recurrent symptoms, f/u with PCP for repeat urine culture   Contacted patient, 05/27/2017 1311   Berle MullMiller, Sadiyah Kangas 05/27/2017, 1:09 PM

## 2017-10-03 ENCOUNTER — Encounter (HOSPITAL_BASED_OUTPATIENT_CLINIC_OR_DEPARTMENT_OTHER): Payer: Self-pay

## 2017-10-03 ENCOUNTER — Emergency Department (HOSPITAL_BASED_OUTPATIENT_CLINIC_OR_DEPARTMENT_OTHER)
Admission: EM | Admit: 2017-10-03 | Discharge: 2017-10-03 | Disposition: A | Discharge status: Home / Self Care | Attending: Emergency Medicine | Admitting: Emergency Medicine

## 2017-10-03 ENCOUNTER — Other Ambulatory Visit: Payer: Self-pay

## 2017-10-03 DIAGNOSIS — M545 Low back pain: Secondary | ICD-10-CM | POA: Diagnosis present

## 2017-10-03 DIAGNOSIS — M5430 Sciatica, unspecified side: Secondary | ICD-10-CM

## 2017-10-03 DIAGNOSIS — F17200 Nicotine dependence, unspecified, uncomplicated: Secondary | ICD-10-CM | POA: Insufficient documentation

## 2017-10-03 DIAGNOSIS — M5432 Sciatica, left side: Secondary | ICD-10-CM | POA: Insufficient documentation

## 2017-10-03 DIAGNOSIS — Z79899 Other long term (current) drug therapy: Secondary | ICD-10-CM | POA: Insufficient documentation

## 2017-10-03 MED ORDER — KETOROLAC TROMETHAMINE 60 MG/2ML IM SOLN
60.0000 mg | Freq: Once | INTRAMUSCULAR | Status: AC
  Administered 2017-10-03: 60 mg via INTRAMUSCULAR
  Filled 2017-10-03: qty 2

## 2017-10-03 MED ORDER — IBUPROFEN 800 MG PO TABS: 800.0000 mg | ORAL_TABLET | Freq: Three times a day (TID) | ORAL | 0 refills | Status: DC

## 2017-10-03 MED ORDER — HYDROCODONE-ACETAMINOPHEN 5-325 MG PO TABS
1.0000 | ORAL_TABLET | Freq: Four times a day (QID) | ORAL | 0 refills | Status: AC | PRN
Start: 2017-10-03 — End: ?

## 2017-10-03 MED ORDER — OXYCODONE-ACETAMINOPHEN 5-325 MG PO TABS
2.0000 | ORAL_TABLET | Freq: Once | ORAL | Status: AC
  Administered 2017-10-03: 2 via ORAL
  Filled 2017-10-03: qty 2

## 2017-10-03 NOTE — ED Provider Notes (Addendum)
[facility] EMERGENCY DEPARTMENT Provider Note   CSN: [id number] Arrival date & time: 10/03/17  0040     History   Chief Complaint Chief Complaint  Patient presents with  . Back Pain    HPI Yolanda Carr is a 57 y.o. female.  Patient is a 57 year old female with history of recurrent UTIs and thyroid disease.  She presents today for evaluation of left sided sciatica.  She states she has had this in the past.  She bent over today to pick up an object when she developed severe pain in her left buttock radiating down into her leg.  She denies any back pain or bowel or bladder complaints.  She denies any numbness, tingling, or weakness.  The history is provided by the patient.  Back Pain   This is a new problem. The current episode started 3 to 5 hours ago. The problem occurs constantly. The problem has been rapidly worsening. Associated with: Bending over. The quality of the pain is described as stabbing. The pain radiates to the left thigh. The pain is severe. The symptoms are aggravated by bending, twisting and certain positions.    Past Medical History:  Diagnosis Date  . History of recurrent UTIs   . Thyroid disease     There are no active problems to display for this patient.   Past Surgical History:  Procedure Laterality Date  . BIOPSY BREAST    . CESAREAN SECTION  1996     OB History    Gravida  0   Para  0   Term  0   Preterm  0   AB  0   Living        SAB  0   TAB  0   Ectopic  0   Multiple      Live Births               Home Medications    Prior to Admission medications   Medication Sig Start Date End Date Taking? Authorizing Provider  benzonatate (TESSALON) 100 MG capsule Take 1 capsule (100 mg total) by mouth every 8 (eight) hours. 05/31/16   Alvina Chou, PA  ciprofloxacin (CIPRO) 500 MG tablet Take 1 tablet (500 mg total) by mouth 2 (two) times daily. One po bid x 7 days 02/17/16   Fayrene Helper, PA-C    dicyclomine (BENTYL) 20 MG tablet Take 1 tablet (20 mg total) by mouth 2 (two) times daily as needed for spasms. 10/23/14   Loren Racer, MD  fluticasone (FLONASE) 50 MCG/ACT nasal spray Place 2 sprays into both nostrils daily. 05/30/16   Loren Racer, MD  levothyroxine (SYNTHROID, LEVOTHROID) 50 MCG tablet Take 50 mcg by mouth daily before breakfast.    [provider]  loratadine (CLARITIN) 10 MG tablet Take 1 tablet (10 mg total) by mouth daily. 05/30/16   Loren Racer, MD  Methenamine-Sodium Salicylate (CYSTEX PO) Take 2 tablets by mouth once as needed. For UTI symptoms      [provider]  naproxen (NAPROSYN) 500 MG tablet Take 1 tablet (500 mg total) by mouth 2 (two) times daily with a meal. 03/21/17   Caccavale, Sophia, PA-C  nystatin-triamcinolone (MYCOLOG II) cream Apply to affected area twice daily until resolved 02/18/17   Horton, Mayer Masker, MD  omeprazole (PRILOSEC) 20 MG capsule Take 1 capsule (20 mg total) by mouth daily. 07/20/15   Horton, Mayer Masker, MD  ondansetron (ZOFRAN ODT) 4 MG disintegrating tablet Take 1  tablet (4 mg total) by mouth every 8 (eight) hours as needed for nausea or vomiting. 07/20/15   Horton, Mayer Maskerourtney F, MD  polyethylene glycol (MIRALAX / GLYCOLAX) packet Take 17 g by mouth daily. 10/23/14   Loren RacerYelverton, David, MD  tamoxifen (NOLVADEX) 10 MG tablet Take 10 mg by mouth 2 (two) times daily.      [provider]    Family History No family history on file.  Social History Social History   Tobacco Use  . Smoking status: Current Some Day Smoker  . Smokeless tobacco: Never Used  Substance Use Topics  . Alcohol use: Yes    Comment: socially   . Drug use: No     Allergies   Estrogens and Sulfa antibiotics   Review of Systems Review of Systems  Musculoskeletal: Positive for back pain.  All other systems reviewed and are negative.    Physical Exam Updated Vital Signs BP 105/71 (BP Location: Right Arm)   Pulse 90    Temp 98.4 F (36.9 C) (Oral)   Resp 20   Ht 5\' 4"  (1.626 m)   Wt 85.7 kg (189 lb)   LMP 07/04/2012   SpO2 98%   BMI 32.44 kg/m   Physical Exam  Constitutional: She is oriented to person, place, and time. She appears well-developed and well-nourished. No distress.  HENT:  Head: Normocephalic and atraumatic.  Neck: Normal range of motion. Neck supple.  Pulmonary/Chest: Effort normal.  Musculoskeletal:  There is tenderness to palpation in the left buttock.  Neurological: She is alert and oriented to person, place, and time.  Strength is 5 out of 5 in both lower extremities.  DTRs are trace and symmetrical in the patellar and Achilles tendons bilaterally.  She is able to walk with an antalgic gait.  Skin: Skin is warm and dry. She is not diaphoretic.  Nursing note and vitals reviewed.    ED Treatments / Results  Labs (all labs ordered are listed, but only abnormal results are displayed) Labs Reviewed - No data to display  EKG None  Radiology No results found.  Procedures Procedures (including critical care time)  Medications Ordered in ED Medications  ketorolac (TORADOL) injection 60 mg (has no administration in time range)  oxyCODONE-acetaminophen (PERCOCET/ROXICET) 5-325 MG per tablet 2 tablet (has no administration in time range)     Initial Impression / Assessment and Plan / ED Course  I have reviewed the triage vital signs and the nursing notes.  Pertinent labs & imaging results that were available during my care of the patient were reviewed by me and considered in my medical decision making (see chart for details).  Patient's pain most consistent with sciatica.  There are no red flags that would suggest an emergent situation.  She is feeling better after Percocet and Toradol.  She will be discharged with continued ibuprofen, pain medicine, and follow-up as needed.  [facility] prior to prescriptions written.  Final Clinical Impressions(s) /  ED Diagnoses   Final diagnoses:  None    ED Discharge Orders    None       Geoffery Lyonselo, Alyss Granato, MD 10/03/17 [id number]    Geoffery Lyonselo, Ulah Olmo, MD 10/03/17 [phone]

## 2017-10-03 NOTE — ED Notes (Signed)
Patient states she has only taken motrin for this episode of lower back pain; states she took gabapentin and tramadol and klonipin for previous episode of lower back pain 2 years ago.

## 2017-10-03 NOTE — ED Triage Notes (Signed)
Left side lower back pain that started about two days ago, pain radiates down leg, pt states she has tried motrin, tramadol, gabapentin and klonopin without relief, denies incontinence, denies groin numbness

## 2017-10-03 NOTE — ED Notes (Signed)
ED Provider at bedside. 

## 2017-10-03 NOTE — Discharge Instructions (Addendum)
Ibuprofen 600 mg 3 times daily for the next 5 days.  Hydrocodone is prescribed as needed for pain not relieved with ibuprofen.  Follow-up with your primary doctor if symptoms are not improving in the next few days.

## 2017-10-08 ENCOUNTER — Emergency Department (HOSPITAL_BASED_OUTPATIENT_CLINIC_OR_DEPARTMENT_OTHER)

## 2017-10-08 ENCOUNTER — Emergency Department (HOSPITAL_BASED_OUTPATIENT_CLINIC_OR_DEPARTMENT_OTHER)
Admission: EM | Admit: 2017-10-08 | Discharge: 2017-10-08 | Disposition: A | Discharge status: Home / Self Care | Attending: Emergency Medicine | Admitting: Emergency Medicine

## 2017-10-08 ENCOUNTER — Other Ambulatory Visit: Payer: Self-pay

## 2017-10-08 ENCOUNTER — Encounter (HOSPITAL_BASED_OUTPATIENT_CLINIC_OR_DEPARTMENT_OTHER): Payer: Self-pay

## 2017-10-08 DIAGNOSIS — F17200 Nicotine dependence, unspecified, uncomplicated: Secondary | ICD-10-CM | POA: Insufficient documentation

## 2017-10-08 DIAGNOSIS — R109 Unspecified abdominal pain: Secondary | ICD-10-CM | POA: Insufficient documentation

## 2017-10-08 DIAGNOSIS — Z79899 Other long term (current) drug therapy: Secondary | ICD-10-CM | POA: Diagnosis not present

## 2017-10-08 DIAGNOSIS — R509 Fever, unspecified: Secondary | ICD-10-CM | POA: Insufficient documentation

## 2017-10-08 DIAGNOSIS — E079 Disorder of thyroid, unspecified: Secondary | ICD-10-CM | POA: Insufficient documentation

## 2017-10-08 DIAGNOSIS — R6883 Chills (without fever): Secondary | ICD-10-CM | POA: Diagnosis not present

## 2017-10-08 DIAGNOSIS — R110 Nausea: Secondary | ICD-10-CM | POA: Diagnosis not present

## 2017-10-08 LAB — CBC WITH DIFFERENTIAL/PLATELET
BASOS PCT: 0 %
Basophils Absolute: 0 10*3/uL (ref 0.0–0.1)
EOS PCT: 1 %
Eosinophils Absolute: 0.1 10*3/uL (ref 0.0–0.7)
HEMATOCRIT: 37.2 % (ref 36.0–46.0)
Hemoglobin: 13.5 g/dL (ref 12.0–15.0)
LYMPHS PCT: 20 %
Lymphs Abs: 1.4 10*3/uL (ref 0.7–4.0)
MCH: 35.1 pg — ABNORMAL HIGH (ref 26.0–34.0)
MCHC: 36.3 g/dL — ABNORMAL HIGH (ref 30.0–36.0)
MCV: 96.6 fL (ref 78.0–100.0)
MONOS PCT: 16 %
Monocytes Absolute: 1.1 10*3/uL — ABNORMAL HIGH (ref 0.1–1.0)
Neutro Abs: 4.4 10*3/uL (ref 1.7–7.7)
Neutrophils Relative %: 63 %
Platelets: 74 10*3/uL — ABNORMAL LOW (ref 150–400)
RBC: 3.85 MIL/uL — ABNORMAL LOW (ref 3.87–5.11)
RDW: 13.9 % (ref 11.5–15.5)
SMEAR REVIEW: DECREASED
WBC Morphology: INCREASED
WBC: 7 10*3/uL (ref 4.0–10.5)

## 2017-10-08 LAB — COMPREHENSIVE METABOLIC PANEL
ALT: 31 U/L (ref 0–44)
AST: 58 U/L — ABNORMAL HIGH (ref 15–41)
Albumin: 2.5 g/dL — ABNORMAL LOW (ref 3.5–5.0)
Alkaline Phosphatase: 152 U/L — ABNORMAL HIGH (ref 38–126)
Anion gap: 7 (ref 5–15)
BILIRUBIN TOTAL: 1.4 mg/dL — AB (ref 0.3–1.2)
BUN: 9 mg/dL (ref 6–20)
CHLORIDE: 103 mmol/L (ref 98–111)
CO2: 25 mmol/L (ref 22–32)
CREATININE: 0.62 mg/dL (ref 0.44–1.00)
Calcium: 8.3 mg/dL — ABNORMAL LOW (ref 8.9–10.3)
GFR calc Af Amer: >60 mL/min (ref >60)
GLUCOSE: 203 mg/dL — AB (ref 70–99)
Potassium: 3.6 mmol/L (ref 3.5–5.1)
Sodium: 135 mmol/L (ref 135–145)
TOTAL PROTEIN: 6.3 g/dL — AB (ref 6.5–8.1)

## 2017-10-08 LAB — URINALYSIS, ROUTINE W REFLEX MICROSCOPIC
BILIRUBIN URINE: NEGATIVE
Glucose, UA: NEGATIVE mg/dL
Hgb urine dipstick: NEGATIVE
Ketones, ur: NEGATIVE mg/dL
LEUKOCYTES UA: NEGATIVE
NITRITE: NEGATIVE
PH: 7 (ref 5.0–8.0)
Protein, ur: NEGATIVE mg/dL
SPECIFIC GRAVITY, URINE: 1.015 (ref 1.005–1.030)

## 2017-10-08 LAB — LIPASE, BLOOD: LIPASE: 29 U/L (ref 11–51)

## 2017-10-08 MED ORDER — GI COCKTAIL ~~LOC~~
30.0000 mL | Freq: Once | ORAL | Status: AC
  Administered 2017-10-08: 30 mL via ORAL
  Filled 2017-10-08: qty 30

## 2017-10-08 MED ORDER — MORPHINE SULFATE (PF) 4 MG/ML IV SOLN
4.0000 mg | Freq: Once | INTRAVENOUS | Status: AC
  Administered 2017-10-08: 4 mg via INTRAVENOUS
  Filled 2017-10-08: qty 1

## 2017-10-08 MED ORDER — IBUPROFEN 800 MG PO TABS: 800.0000 mg | ORAL_TABLET | Freq: Three times a day (TID) | ORAL | 0 refills | Status: AC

## 2017-10-08 MED ORDER — ONDANSETRON HCL 4 MG/2ML IJ SOLN
4.0000 mg | Freq: Once | INTRAMUSCULAR | Status: AC
  Administered 2017-10-08: 4 mg via INTRAVENOUS
  Filled 2017-10-08: qty 2

## 2017-10-08 NOTE — ED Provider Notes (Signed)
[facility] EMERGENCY DEPARTMENT Provider Note   CSN: [id number] Arrival date & time: 10/08/17  2045     History   Chief Complaint Chief Complaint  Patient presents with  . Flank Pain    HPI Yolanda Carr is a 57 y.o. female.  57 yo F with a chief complaint of left flank pain.  This is been going on for the past week or so.  Was seen in the emergency department and then by her primary care doctor.  Thought to be musculoskeletal she had some improvement with Tylenol and NSAIDs and exercises.  However today she felt that it suddenly got much worse.  She also felt that she started feeling like she was feverish and having chills and severe nausea.  She feels that her urine had changed color but denied any other urinary symptoms.  Initially her pain was worse with movement and palpation though she does not think this has continued to be the case it seems to come and go on its own.  The history is provided by the patient.  Flank Pain  This is a new problem. The current episode started more than 1 week ago. The problem occurs constantly. The problem has been rapidly worsening. Pertinent negatives include no chest pain, no headaches and no shortness of breath. Nothing aggravates the symptoms. Nothing relieves the symptoms. She has tried nothing for the symptoms. The treatment provided no relief.    Past Medical History:  Diagnosis Date  . History of recurrent UTIs   . Thyroid disease     There are no active problems to display for this patient.   Past Surgical History:  Procedure Laterality Date  . BIOPSY BREAST    . CESAREAN SECTION  1996     OB History    Gravida  0   Para  0   Term  0   Preterm  0   AB  0   Living        SAB  0   TAB  0   Ectopic  0   Multiple      Live Births               Home Medications    Prior to Admission medications   Medication Sig Start Date End Date Taking? Authorizing Provider  benzonatate (TESSALON) 100  MG capsule Take 1 capsule (100 mg total) by mouth every 8 (eight) hours. 05/31/16   Alvina Chou, PA  ciprofloxacin (CIPRO) 500 MG tablet Take 1 tablet (500 mg total) by mouth 2 (two) times daily. One po bid x 7 days 02/17/16   Fayrene Helper, PA-C  dicyclomine (BENTYL) 20 MG tablet Take 1 tablet (20 mg total) by mouth 2 (two) times daily as needed for spasms. 10/23/14   Loren Racer, MD  fluticasone (FLONASE) 50 MCG/ACT nasal spray Place 2 sprays into both nostrils daily. 05/30/16   Loren Racer, MD  HYDROcodone-acetaminophen (NORCO) 5-325 MG tablet Take 1-2 tablets by mouth every 6 (six) hours as needed. 10/03/17   Geoffery Lyons, MD  ibuprofen (ADVIL,MOTRIN) 800 MG tablet Take 1 tablet (800 mg total) by mouth 3 (three) times daily. 10/08/17   Melene Plan, DO  levothyroxine (SYNTHROID, LEVOTHROID) 50 MCG tablet Take 50 mcg by mouth daily before breakfast.    [provider]  loratadine (CLARITIN) 10 MG tablet Take 1 tablet (10 mg total) by mouth daily. 05/30/16   Loren Racer, MD  Methenamine-Sodium Salicylate (CYSTEX PO) Take 2 tablets  by mouth once as needed. For UTI symptoms      [provider]  naproxen (NAPROSYN) 500 MG tablet Take 1 tablet (500 mg total) by mouth 2 (two) times daily with a meal. 03/21/17   Caccavale, Sophia, PA-C  nystatin-triamcinolone (MYCOLOG II) cream Apply to affected area twice daily until resolved 02/18/17   Horton, Mayer Masker, MD  omeprazole (PRILOSEC) 20 MG capsule Take 1 capsule (20 mg total) by mouth daily. 07/20/15   Horton, Mayer Masker, MD  ondansetron (ZOFRAN ODT) 4 MG disintegrating tablet Take 1 tablet (4 mg total) by mouth every 8 (eight) hours as needed for nausea or vomiting. 07/20/15   Horton, Mayer Masker, MD  polyethylene glycol (MIRALAX / GLYCOLAX) packet Take 17 g by mouth daily. 10/23/14   Loren Racer, MD  tamoxifen (NOLVADEX) 10 MG tablet Take 10 mg by mouth 2 (two) times daily.      [provider]    Family  History No family history on file.  Social History Social History   Tobacco Use  . Smoking status: Current Some Day Smoker  . Smokeless tobacco: Never Used  Substance Use Topics  . Alcohol use: Yes    Comment: socially   . Drug use: No     Allergies   Estrogens and Sulfa antibiotics   Review of Systems Review of Systems  Constitutional: Positive for chills and fever.  HENT: Negative for congestion and rhinorrhea.   Eyes: Negative for redness and visual disturbance.  Respiratory: Negative for shortness of breath and wheezing.   Cardiovascular: Negative for chest pain and palpitations.  Gastrointestinal: Negative for nausea and vomiting.  Genitourinary: Positive for flank pain. Negative for dysuria and urgency.  Musculoskeletal: Negative for arthralgias and myalgias.  Skin: Negative for pallor and wound.  Neurological: Negative for dizziness and headaches.     Physical Exam Updated Vital Signs BP (!) 111/58 (BP Location: Right Arm)   Pulse (!) 102   Temp 99.8 F (37.7 C) (Oral)   Resp 20   Ht 5\' 5"  (1.651 m)   Wt 90.7 kg (200 lb)   LMP 07/04/2012   SpO2 93%   BMI 33.28 kg/m   Physical Exam  Constitutional: She is oriented to person, place, and time. She appears well-developed and well-nourished. No distress.  HENT:  Head: Normocephalic and atraumatic.  Eyes: Pupils are equal, round, and reactive to light. EOM are normal.  Neck: Normal range of motion. Neck supple.  Cardiovascular: Normal rate and regular rhythm. Exam reveals no gallop and no friction rub.  No murmur heard. Pulmonary/Chest: Effort normal. She has no wheezes. She has no rales.  Abdominal: Soft. She exhibits no distension and no mass. There is no tenderness. There is no guarding.  Musculoskeletal: She exhibits no edema or tenderness.  Neurological: She is alert and oriented to person, place, and time.  Skin: Skin is warm and dry. She is not diaphoretic.  Psychiatric: She has a normal mood and  affect. Her behavior is normal.  Nursing note and vitals reviewed.    ED Treatments / Results  Labs (all labs ordered are listed, but only abnormal results are displayed) Labs Reviewed  CBC WITH DIFFERENTIAL/PLATELET - Abnormal; Notable for the following components:      Result Value   RBC 3.85 (*)    MCH 35.1 (*)    MCHC 36.3 (*)    Platelets 74 (*)    Monocytes Absolute 1.1 (*)    All other components within normal limits  COMPREHENSIVE METABOLIC PANEL - Abnormal; Notable for the following components:   Glucose, Bld 203 (*)    Calcium 8.3 (*)    Total Protein 6.3 (*)    Albumin 2.5 (*)    AST 58 (*)    Alkaline Phosphatase 152 (*)    Total Bilirubin 1.4 (*)    All other components within normal limits  URINALYSIS, ROUTINE W REFLEX MICROSCOPIC  LIPASE, BLOOD    EKG None  Radiology Ct Renal Stone Study  Result Date: 10/08/2017 CLINICAL DATA:  57 year old female with flank pain. Stone disease suspected. EXAM: CT ABDOMEN AND PELVIS WITHOUT CONTRAST TECHNIQUE: Multidetector CT imaging of the abdomen and pelvis was performed following the standard protocol without IV contrast. COMPARISON:  Abdominal ultrasound dated 07/20/2015 FINDINGS: Evaluation of this exam is limited in the absence of intravenous contrast. Lower chest: There is diffuse increased interstitial and interlobular septal thickening of the visualized lung bases concerning for interstitial edema. An atypical or viral pneumonia is not entirely excluded. Clinical correlation is recommended. There is no intra-abdominal free air.  There is a small ascites. Hepatobiliary: Cirrhosis. No intrahepatic biliary ductal dilatation. Evaluation for liver lesion is very limited on this noncontrast CT. There is a small stone in the gallbladder. The gallbladder wall appears thickened and edematous which may be related to underlying systemic/liver disease and ascites. Ultrasound or a HIDA scan may provide better evaluation if there is high  clinical concern for acute cholecystitis. Pancreas: Unremarkable. No pancreatic ductal dilatation or surrounding inflammatory changes. Spleen: Normal in size without focal abnormality. Adrenals/Urinary Tract: The adrenal glands are unremarkable. There is no hydronephrosis or nephrolithiasis on either side. The visualized ureters and urinary bladder appear unremarkable. Stomach/Bowel: No bowel obstruction or active inflammation. The appendix is normal. Vascular/Lymphatic: Mild aortoiliac atherosclerotic disease. The abdominal aorta and IVC are otherwise unremarkable on this noncontrast CT. No portal venous gas. Mildly enlarged portacaval lymph node measuring 16 mm in short axis, likely reactive. Reproductive: The uterus and ovaries are grossly unremarkable. Other: Small fat containing umbilical hernia. Partially visualized left breast implant. Musculoskeletal: Mild degenerative changes of the spine. No acute osseous pathology. IMPRESSION: 1. No hydronephrosis or nephrolithiasis. 2. No bowel obstruction or active inflammation.  Normal appendix. 3. Cirrhosis with small ascites. 4. Small gallbladder calculus. There is thickened and edematous appearing gallbladder wall, likely related to systemic disease, cirrhosis, and ascites. Ultrasound or HIDA scan may provide better evaluation if there is high clinical concern for acute cholecystitis. 5. Mild interstitial edema. Electronically Signed   By: Elgie CollardArash  Radparvar M.D.   On: 10/08/2017 23:30    Procedures Procedures (including critical care time)  Medications Ordered in ED Medications  morphine 4 MG/ML injection 4 mg (4 mg Intravenous Given 10/08/17 2150)  ondansetron (ZOFRAN) injection 4 mg (4 mg Intravenous Given 10/08/17 2150)  gi cocktail (Maalox,Lidocaine,Donnatal) (30 mLs Oral Given 10/08/17 2150)     Initial Impression / Assessment and Plan / ED Course  I have reviewed the triage vital signs and the nursing notes.  Pertinent labs & imaging results that  were available during my care of the patient were reviewed by me and considered in my medical decision making (see chart for details).     57 yo F with a chief complaint of left flank pain.  Seen about a week ago and thought to be muscular skeletal and had some improvement until yesterday.  Describing some infectious type complaints.  I am unable to reproduce her back pain on my exam.  Will obtain a stone study labs UA.  CT negative, ua without infection, she is not anemic, thrombocytopenia mild elevation of her LFTs and total bili.  CT scan with no concerning intra-abdominal findings.  No kidney stones.  Treat as muscular skeletal.  Discharge home.  12:12 AM:  I have discussed the diagnosis/risks/treatment options with the patient and family and believe the pt to be eligible for discharge home to follow-up with PCP. We also discussed returning to the ED immediately if new or worsening sx occur. We discussed the sx which are most concerning (e.g., sudden worsening pain, fever, inability to tolerate by mouth) that necessitate immediate return. Medications administered to the patient during their visit and any new prescriptions provided to the patient are listed below.  Medications given during this visit Medications  morphine 4 MG/ML injection 4 mg (4 mg Intravenous Given 10/08/17 2150)  ondansetron (ZOFRAN) injection 4 mg (4 mg Intravenous Given 10/08/17 2150)  gi cocktail (Maalox,Lidocaine,Donnatal) (30 mLs Oral Given 10/08/17 2150)     The patient appears reasonably screen and/or stabilized for discharge and I doubt any other medical condition or other [facility] requiring further screening, evaluation, or treatment in the ED at this time prior to discharge.    Final Clinical Impressions(s) / ED Diagnoses   Final diagnoses:  Left flank pain    ED Discharge Orders        Ordered    ibuprofen (ADVIL,MOTRIN) 800 MG tablet  3 times daily     10/08/17 2359       Melene Plan, DO 10/09/17 0012

## 2017-10-08 NOTE — ED Triage Notes (Signed)
C/o left flank pain, fever-NAD-steady gait

## 2017-10-08 NOTE — ED Notes (Signed)
Pt states she still have around 6 pills of Vicodin for pain at home, but she is almost out of the Ibuprofen prescribed 5 days ago. Pt states she was getting some relieve with the pain pill, but now her pain is upper on her kidneys and her urine don't looks right for her.

## 2017-10-08 NOTE — ED Notes (Signed)
Pt verbalizes understanding of dc instructions and denies any further needs.  Pt is encouraged to follow up with her PCP, but pt states that she won't bc [place] in [place].  She states that the ED is free for her, so she will just come back instead of following up.  Pt requesting prescription for motrin bc it is free if she has a prescription.

## 2018-02-19 IMAGING — CR DG ABDOMEN ACUTE W/ 1V CHEST
4 series · 4 of 4 positions shown · non-contrast
Comparison: 10/23/2014

CLINICAL DATA: Epigastric pain with nausea, intermittent all week
but worsened tonight.

EXAM:
DG ABDOMEN ACUTE W/ 1V CHEST

[w chest pa]
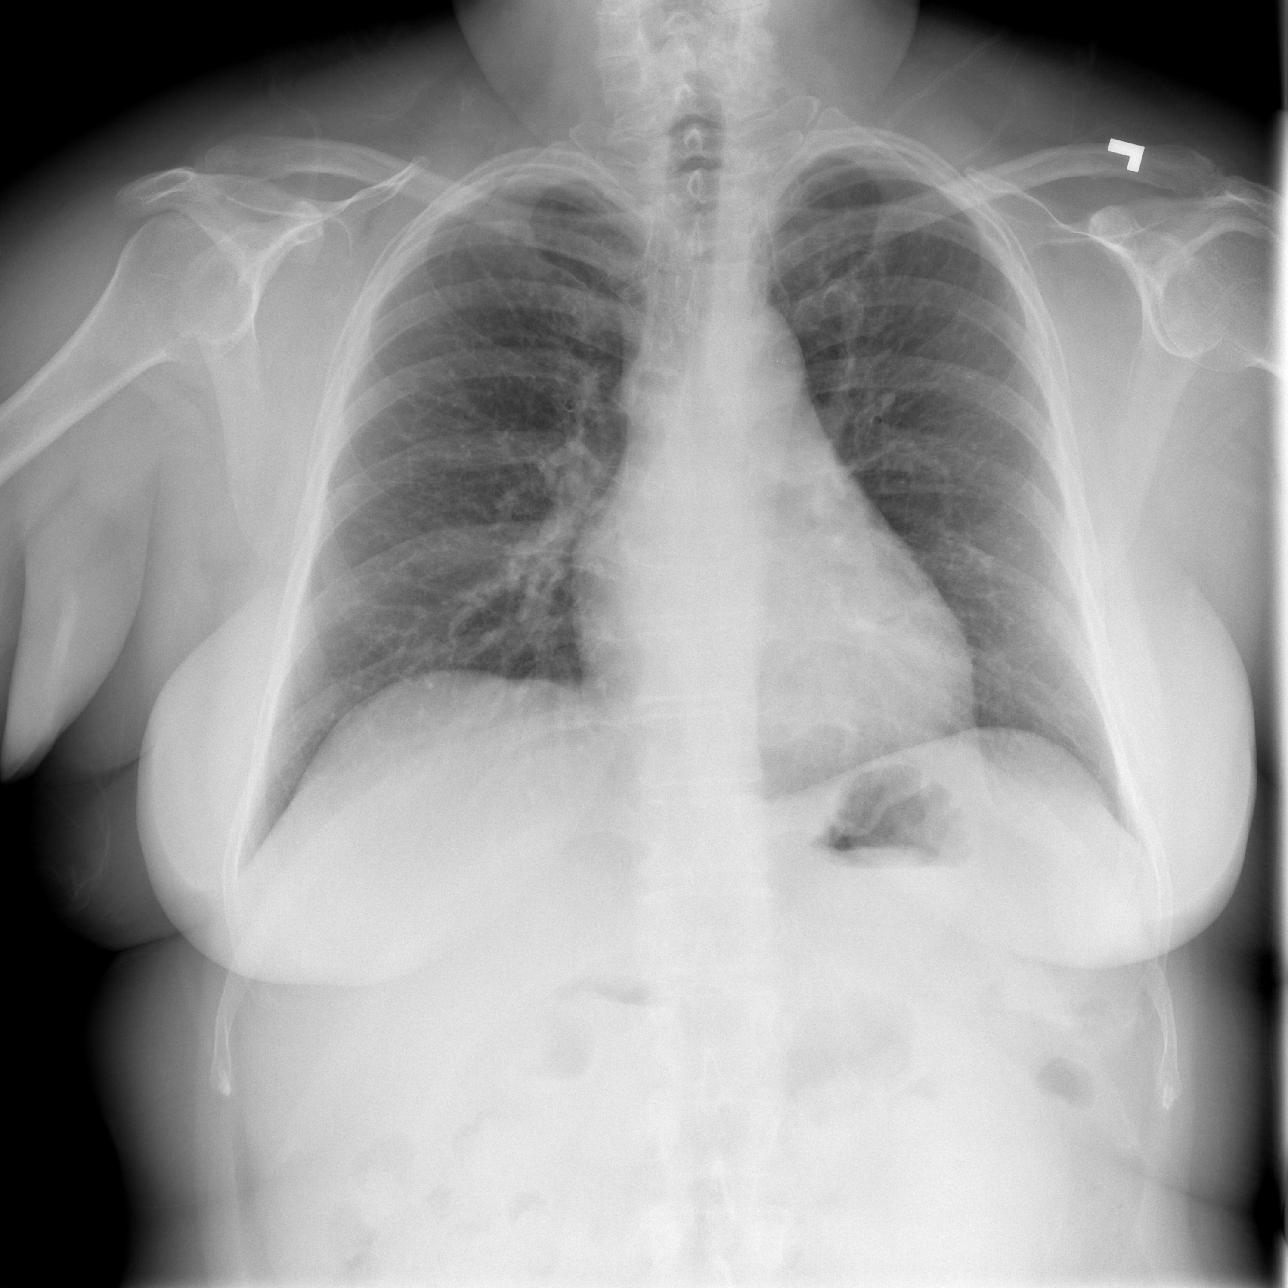

[w abdomen upright]
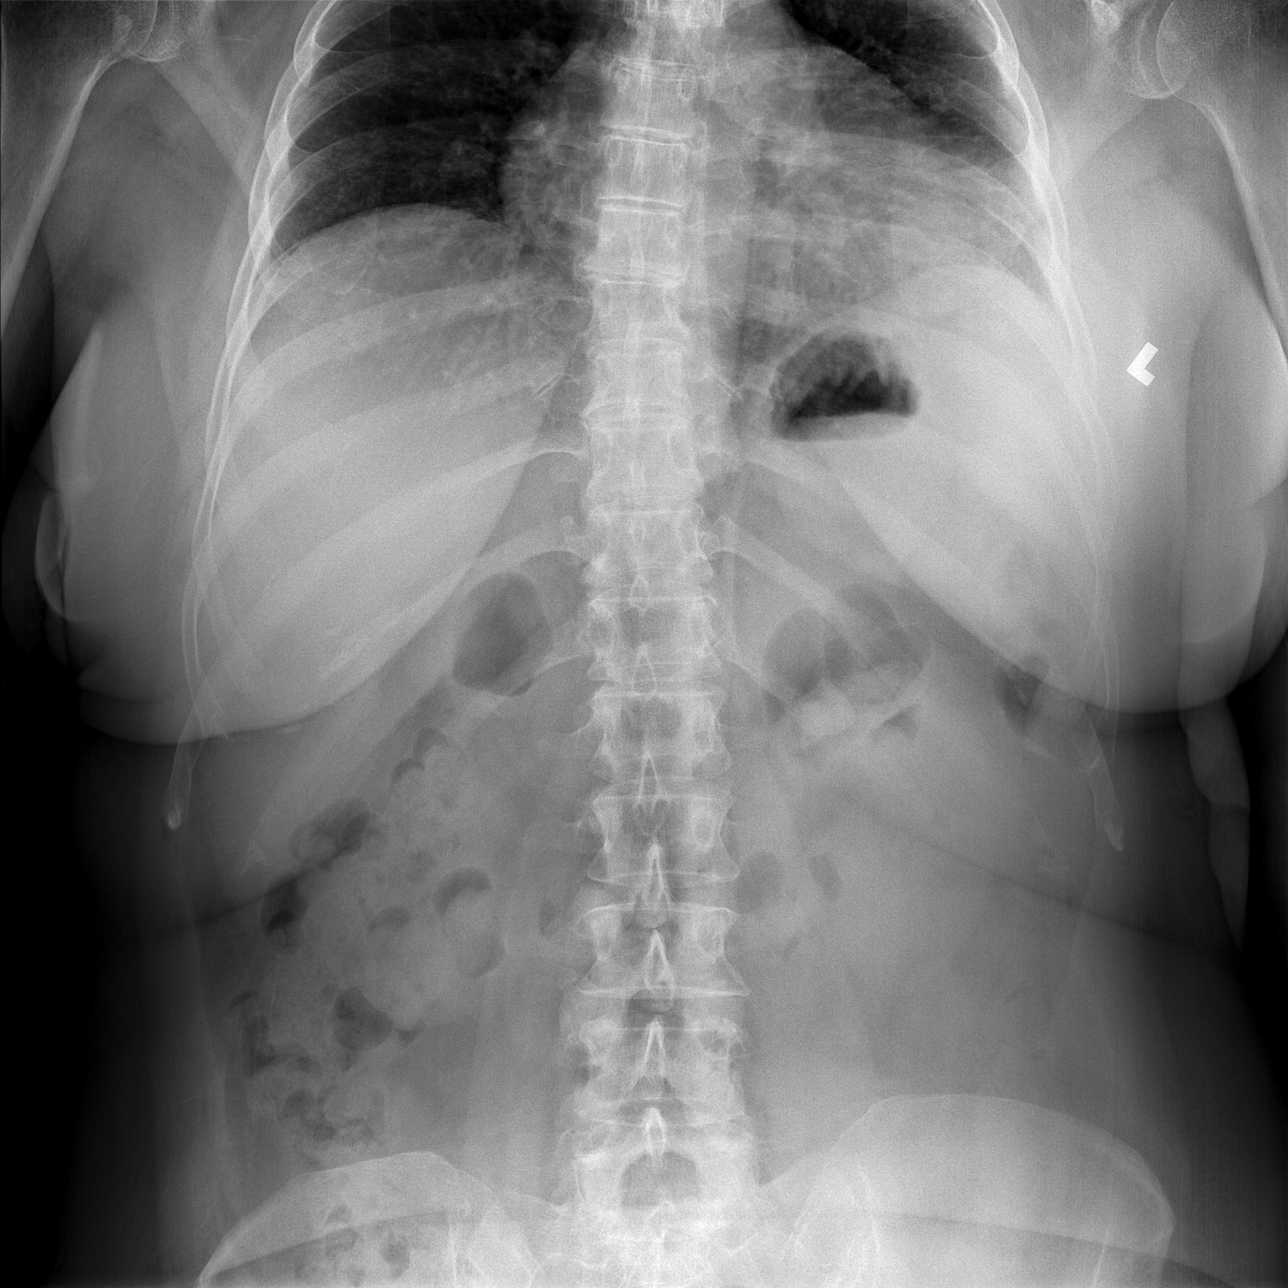

[t abdomen supine (1 of 2)]
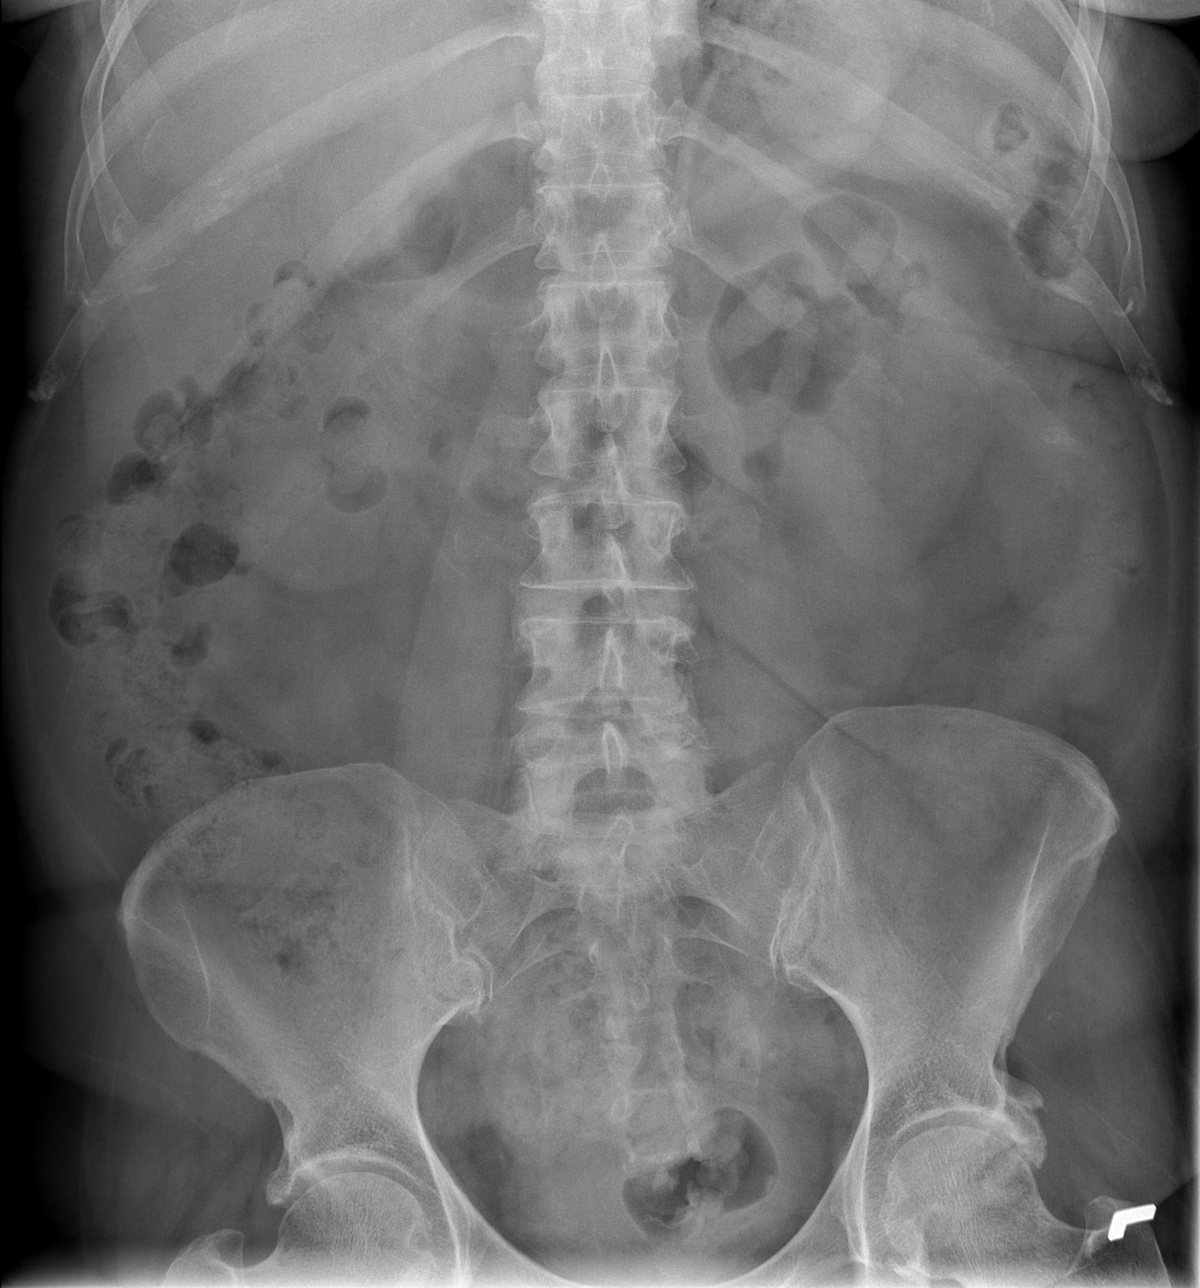

[t abdomen supine (2 of 2)]
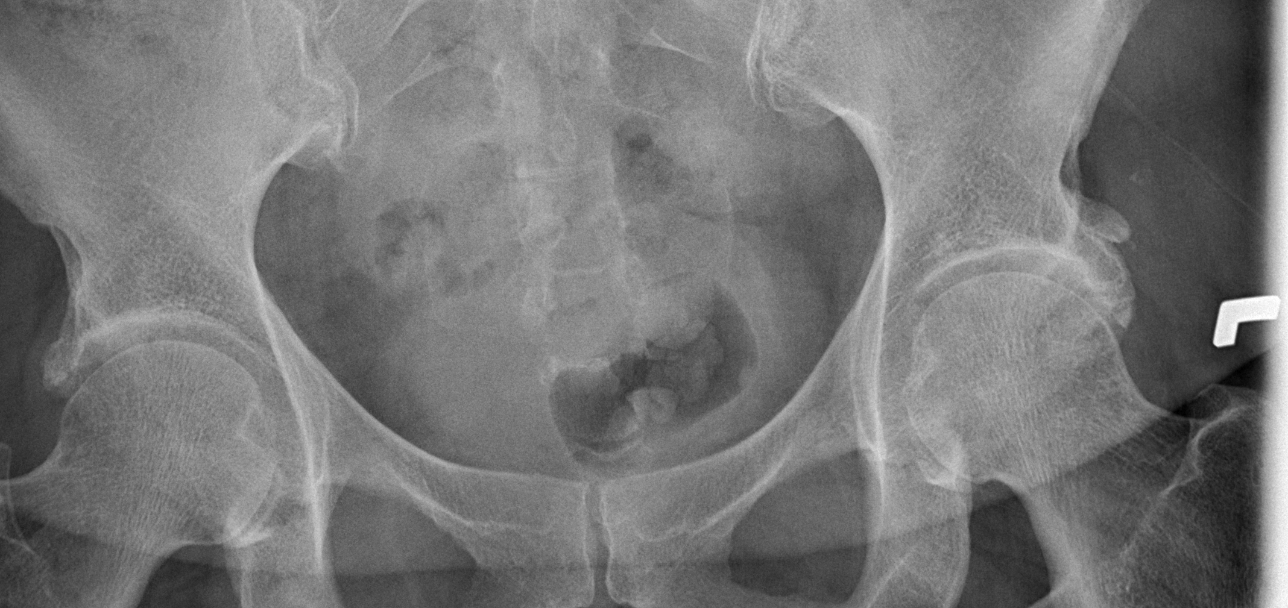

[4 of 4 positions shown; findings below may reference images not displayed]

FINDINGS: There is no evidence of dilated bowel loops or free intraperitoneal
air. No radiopaque calculi or other significant radiographic
abnormality is seen. Heart size and mediastinal contours are within
normal limits. Both lungs are clear.
IMPRESSION: Negative abdominal radiographs.  No acute cardiopulmonary disease.

## 2018-03-10 ENCOUNTER — Encounter (HOSPITAL_BASED_OUTPATIENT_CLINIC_OR_DEPARTMENT_OTHER): Payer: Self-pay | Admitting: *Deleted

## 2018-03-10 ENCOUNTER — Other Ambulatory Visit: Payer: Self-pay

## 2018-03-10 ENCOUNTER — Emergency Department (HOSPITAL_BASED_OUTPATIENT_CLINIC_OR_DEPARTMENT_OTHER)
Admission: EM | Admit: 2018-03-10 | Discharge: 2018-03-10 | Disposition: A | Discharge status: Home / Self Care | Attending: Emergency Medicine | Admitting: Emergency Medicine

## 2018-03-10 DIAGNOSIS — E119 Type 2 diabetes mellitus without complications: Secondary | ICD-10-CM | POA: Diagnosis not present

## 2018-03-10 DIAGNOSIS — N3001 Acute cystitis with hematuria: Secondary | ICD-10-CM | POA: Diagnosis not present

## 2018-03-10 DIAGNOSIS — Z87440 Personal history of urinary (tract) infections: Secondary | ICD-10-CM | POA: Insufficient documentation

## 2018-03-10 DIAGNOSIS — R300 Dysuria: Secondary | ICD-10-CM | POA: Diagnosis present

## 2018-03-10 DIAGNOSIS — Z79899 Other long term (current) drug therapy: Secondary | ICD-10-CM | POA: Diagnosis not present

## 2018-03-10 LAB — URINALYSIS, ROUTINE W REFLEX MICROSCOPIC
Glucose, UA: NEGATIVE mg/dL
KETONES UR: NEGATIVE mg/dL
NITRITE: NEGATIVE
Protein, ur: 30 mg/dL — AB
Specific Gravity, Urine: 1.015 (ref 1.005–1.030)
pH: 8.5 — ABNORMAL HIGH (ref 5.0–8.0)

## 2018-03-10 LAB — URINALYSIS, MICROSCOPIC (REFLEX)

## 2018-03-10 MED ORDER — CIPROFLOXACIN HCL 500 MG PO TABS
250.0000 mg | ORAL_TABLET | Freq: Once | ORAL | Status: AC
  Administered 2018-03-10: 250 mg via ORAL

## 2018-03-10 MED ORDER — CIPROFLOXACIN HCL 500 MG PO TABS: 500.0000 mg | ORAL_TABLET | Freq: Two times a day (BID) | ORAL | 0 refills | Status: AC

## 2018-03-10 MED ORDER — CIPROFLOXACIN HCL 500 MG PO TABS
250.0000 mg | ORAL_TABLET | Freq: Once | ORAL | Status: AC
  Administered 2018-03-10: 250 mg via ORAL
  Filled 2018-03-10: qty 1

## 2018-03-10 MED FILL — CIPROFLOXACIN HCL 500 MG TA: 500 | 7 days supply | Qty: 13 | Fill #0

## 2018-03-10 NOTE — ED Triage Notes (Signed)
Pt c/o painful freq urination x 1 day 

## 2018-03-10 NOTE — ED Provider Notes (Signed)
[facility] EMERGENCY DEPARTMENT Provider Note   CSN: [id number] Arrival date & time: 03/10/18  1354     History   Chief Complaint Chief Complaint  Patient presents with  . Dysuria    HPI Yolanda Carr is a 57 y.o. female.  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  Patient reports that she woke up this morning with a sensation of pressure in the seborrheic region, and had burning with urination.  She also reports frequency and incomplete emptying.  She reports that she experience is at least one urinary tract infection yearly.  She reports that she is going out received bacteria in the past.  Patient reports that she is no longer taking anti-hyperglycemics due to losing weight and improving her glucose control.  She also reports that she has completed her 12-week antibiotic course a couple months ago for the bacteremia.  Patient denies any fevers, chills, abdominal pain, nausea, vomiting, or flank pain.  Past Medical History:  Diagnosis Date  . History of recurrent UTIs   . Thyroid disease     There are no active problems to display for this patient.   Past Surgical History:  Procedure Laterality Date  . BIOPSY BREAST    . CESAREAN SECTION  1996     OB History    Gravida  0   Para  0   Term  0   Preterm  0   AB  0   Living        SAB  0   TAB  0   Ectopic  0   Multiple      Live Births               Home Medications    Prior to Admission medications   Medication Sig Start Date End Date Taking? Authorizing Provider  benzonatate (TESSALON) 100 MG capsule Take 1 capsule (100 mg total) by mouth every 8 (eight) hours. 05/31/16   Espina, Lucita LoraFrancisco Manuel, PA  fluticasone (FLONASE) 50 MCG/ACT nasal spray  Place 2 sprays into both nostrils daily. 05/30/16   Loren RacerYelverton, David, MD  HYDROcodone-acetaminophen (NORCO) 5-325 MG tablet Take 1-2 tablets by mouth every 6 (six) hours as needed. 10/03/17   Geoffery Lyonselo, Douglas, MD  ibuprofen (ADVIL,MOTRIN) 800 MG tablet Take 1 tablet (800 mg total) by mouth 3 (three) times daily. 10/08/17   Melene PlanFloyd, Dan, DO  loratadine (CLARITIN) 10 MG tablet Take 1 tablet (10 mg total) by mouth daily. 05/30/16   Loren RacerYelverton, David, MD  Methenamine-Sodium Salicylate (CYSTEX PO) Take 2 tablets by mouth once as needed. For UTI symptoms      [provider]  naproxen (NAPROSYN) 500 MG tablet Take 1 tablet (500 mg total) by mouth 2 (two) times daily with a meal. 03/21/17   Caccavale, Sophia, PA-C  nystatin-triamcinolone (MYCOLOG II) cream Apply to affected area twice daily until resolved 02/18/17   Horton, Mayer Maskerourtney F, MD  omeprazole (PRILOSEC) 20 MG capsule Take 1 capsule (20 mg total) by mouth daily. 07/20/15   Horton, Mayer Maskerourtney F, MD  ondansetron (ZOFRAN ODT) 4 MG disintegrating tablet Take 1 tablet (4 mg total) by mouth every 8 (eight) hours as needed for nausea or vomiting. 07/20/15   Horton, Mayer Maskerourtney F, MD  polyethylene glycol (MIRALAX / GLYCOLAX) packet Take 17 g by mouth daily.  10/23/14   Loren Racer, MD  tamoxifen (NOLVADEX) 10 MG tablet Take 10 mg by mouth 2 (two) times daily.      [provider]    Family History History reviewed. No pertinent family history.  Social History Social History   Tobacco Use  . Smoking status: Current Some Day Smoker  . Smokeless tobacco: Never Used  Substance Use Topics  . Alcohol use: Yes    Comment: socially   . Drug use: No     Allergies   Estrogens and Sulfa antibiotics   Review of Systems Review of Systems  Constitutional: Negative for chills and fever.  Gastrointestinal: Negative for abdominal pain, nausea and vomiting.  Genitourinary: Positive for decreased urine volume, dysuria and frequency. Negative for flank  pain, pelvic pain, vaginal discharge and vaginal pain.  All other systems reviewed and are negative.    Physical Exam Updated Vital Signs BP 123/73   Pulse 92   Temp 98.2 F (36.8 C) (Oral)   Resp 18   Ht 5\' 5"  (1.651 m)   Wt 77.1 kg   LMP 07/04/2012   SpO2 98%   BMI 28.29 kg/m   Physical Exam  Constitutional: She appears well-developed and well-nourished. No distress.  HENT:  Head: Normocephalic and atraumatic.  Mouth/Throat: Oropharynx is clear and moist.  Eyes: Pupils are equal, round, and reactive to light. Conjunctivae and EOM are normal.  Neck: Normal range of motion. Neck supple.  Cardiovascular: Normal rate, regular rhythm, S1 normal and S2 normal.  No murmur heard. Pulmonary/Chest: Effort normal and breath sounds normal. She has no wheezes. She has no rales.  Abdominal: Soft. She exhibits no distension. There is no tenderness. There is no guarding.  No CVA tenderness.  Musculoskeletal: Normal range of motion. She exhibits no edema or deformity.  Neurological: She is alert.  Cranial nerves grossly intact. Patient moves extremities symmetrically and with good coordination.  Skin: Skin is warm and dry. No rash noted. No erythema.  Psychiatric: She has a normal mood and affect. Her behavior is normal. Judgment and thought content normal.  Nursing note and vitals reviewed.    ED Treatments / Results  Labs (all labs ordered are listed, but only abnormal results are displayed) Labs Reviewed  URINALYSIS, ROUTINE W REFLEX MICROSCOPIC - Abnormal; Notable for the following components:      Result Value   pH 8.5 (*)    Hgb urine dipstick LARGE (*)    Bilirubin Urine SMALL (*)    Protein, ur 30 (*)    Leukocytes, UA LARGE (*)    All other components within normal limits  URINALYSIS, MICROSCOPIC (REFLEX) - Abnormal; Notable for the following components:   Bacteria, UA MANY (*)    All other components within normal limits  URINE CULTURE     EKG None  Radiology No results found.  Procedures Procedures (including critical care time)  Medications Ordered in ED Medications  ciprofloxacin (CIPRO) tablet 250 mg (has no administration in time range)     Initial Impression / Assessment and Plan / ED Course  I have reviewed the triage vital signs and the nursing notes.  Pertinent labs & imaging results that were available during my care of the patient were reviewed by me and considered in my medical decision making (see chart for details).     Patient is nontoxic-appearing, afebrile, and has benign abdomen.  Urinalysis is demonstrating infection.  No WBC clumps were noted by lab.  Patient's past medical history and  culture reports were reviewed, and appears that patient has previously had Staphylococcus species that were resistant to multiple antibiotics.  Patient also noting that she is previously had to always be switched to ciprofloxacin.  While patient has had improvement of her diabetes and is no longer being treated for bacteremia, will treat with higher dose of ciprofloxacin due to history of immune compromise status and recent medical history.  Previous laboratory data was reviewed, patient most recently had normal renal functio in June 2019.  Patient was given return precautions for any fevers, worsening pain, nausea or vomiting, or flank pain.  Patient is in understanding and agrees with plan of care.  This is a supervised visit with Dr. Kennis Carina. Evaluation, management, and discharge planning discussed with this attending physician.  Final Clinical Impressions(s) / ED Diagnoses   Final diagnoses:  Acute cystitis with hematuria    ED Discharge Orders         Ordered    ciprofloxacin (CIPRO) 500 MG tablet  Every 12 hours     03/10/18 1531           Delia Chimes 03/10/18 1532    Sabas Sous, MD 03/11/18 [phone]

## 2018-03-10 NOTE — Discharge Instructions (Signed)
Please see the information and instructions below regarding your visit.  Your diagnoses today include:  1. Acute cystitis with hematuria    Your symptoms are caused by a urinary tract infection, which occurs when bacteria travel up into the bladder. This is a very common condition. Often, bacteria is transmitted while wiping after using the restroom. It is reassuring that you do not have a fever today.  Tests performed today include: See side panel of your discharge paperwork for testing performed today. Vital signs are listed at the bottom of these instructions.  -Urine tests. Suggestive of infection.  Medications prescribed:    Take any prescribed medications only as prescribed, and any over the counter medications only as directed on the packaging.  Please take all of your antibiotics until finished.   You may develop abdominal discomfort or nausea from the antibiotic. If this occurs, you may take it with food. Some patients also get diarrhea with antibiotics. You may help offset this with probiotics which you can buy or get in yogurt. Do not eat or take the probiotics until 2 hours after your antibiotic. Some women develop vaginal yeast infections after antibiotics. If you develop unusual vaginal discharge after being on this medication, please see your primary care provider.   Some people develop allergies to antibiotics. Symptoms of antibiotic allergy can be mild and include a flat rash and itching. They can also be more serious and include:  ?Hives - Hives are raised, red patches of skin that are usually very itchy.  ?Lip or tongue swelling  ?Trouble swallowing or breathing  ?Blistering of the skin or mouth.  If you have any of these serious symptoms, please seek emergency medical care immediately.   Home care instructions:  Please follow any educational materials contained in this packet.  Please stay hydrated by making sure that your urine is very light yellow in color.     Follow-up instructions: Please follow-up with your primary care provider in one week for further evaluation of your symptoms.   Return instructions:  Please return to the Emergency Department if you experience worsening symptoms. Please seek immediate care if you develop the following: Your symptoms are no better or worse in 3 days. There is severe back pain or lower abdominal pain.  You develop chills.  You have a fever.  There is nausea or vomiting.  There is continued burning or discomfort with urination.  You have any additional concerns.  Please return if you have any other emergent concerns.  Additional Information:   Your vital signs today were: BP 123/73    Pulse 92    Temp 98.2 F (36.8 C) (Oral)    Resp 18    Ht 5\' 5"  (1.651 m)    Wt 77.1 kg    LMP 07/04/2012    SpO2 98%    BMI 28.29 kg/m  If your blood pressure (BP) was elevated on multiple readings during this visit above 130 for the top number or above 80 for the bottom number, please have this repeated by your primary care provider within one month. --------------  Thank you for allowing us to participate in your care today.

## 2018-03-12 LAB — URINE CULTURE

## 2018-03-13 ENCOUNTER — Telehealth: Payer: Self-pay | Admitting: *Deleted

## 2018-03-13 NOTE — Telephone Encounter (Signed)
Post ED Visit - Positive Culture Follow-up  Culture report reviewed by antimicrobial stewardship pharmacist:  []  Yolanda Carr, Pharm.D. []  Yolanda Carr, Pharm.D., BCPS AQ-ID [x]  Yolanda Carr, Pharm.D., BCPS []  Yolanda Carr, Pharm.D., BCPS []  Yolanda Carr, [place] BCPS, AAHIVP []  Yolanda Carr, Pharm.D., BCPS, AAHIVP []  Yolanda Carr, PharmD, BCPS []  Yolanda Carr, PharmD, BCPS []  Yolanda Carr, PharmD, BCPS []  Yolanda Carr, PharmD  Positive urine culture Treated with Ciprofloxacin HCL, organism sensitive to the same and no further patient follow-up is required at this time.  Yolanda Carr, Yolanda Carr 03/13/2018, 9:40 AM

## 2019-05-12 ENCOUNTER — Other Ambulatory Visit: Payer: Self-pay

## 2019-05-12 ENCOUNTER — Encounter (HOSPITAL_BASED_OUTPATIENT_CLINIC_OR_DEPARTMENT_OTHER): Payer: Self-pay | Admitting: *Deleted

## 2019-05-12 ENCOUNTER — Emergency Department (HOSPITAL_BASED_OUTPATIENT_CLINIC_OR_DEPARTMENT_OTHER)
Admission: EM | Admit: 2019-05-12 | Discharge: 2019-05-12 | Disposition: A | Discharge status: Home / Self Care | Attending: Emergency Medicine | Admitting: Emergency Medicine

## 2019-05-12 DIAGNOSIS — N3001 Acute cystitis with hematuria: Secondary | ICD-10-CM | POA: Diagnosis not present

## 2019-05-12 DIAGNOSIS — F17200 Nicotine dependence, unspecified, uncomplicated: Secondary | ICD-10-CM | POA: Insufficient documentation

## 2019-05-12 DIAGNOSIS — Z79899 Other long term (current) drug therapy: Secondary | ICD-10-CM | POA: Diagnosis not present

## 2019-05-12 DIAGNOSIS — R300 Dysuria: Secondary | ICD-10-CM | POA: Diagnosis present

## 2019-05-12 LAB — URINALYSIS, ROUTINE W REFLEX MICROSCOPIC
Bilirubin Urine: NEGATIVE
Glucose, UA: NEGATIVE mg/dL
Ketones, ur: NEGATIVE mg/dL
Nitrite: NEGATIVE
Protein, ur: NEGATIVE mg/dL
Specific Gravity, Urine: <1.005 — ABNORMAL LOW (ref 1.005–1.030)
pH: 6 (ref 5.0–8.0)

## 2019-05-12 LAB — URINALYSIS, MICROSCOPIC (REFLEX)

## 2019-05-12 MED ORDER — CIPROFLOXACIN HCL 500 MG PO TABS: 500.0000 mg | ORAL_TABLET | Freq: Two times a day (BID) | ORAL | 0 refills | Status: DC

## 2019-05-12 MED ORDER — FLUCONAZOLE 200 MG PO TABS: 200.0000 mg | ORAL_TABLET | Freq: Every day | ORAL | 0 refills | Status: AC

## 2019-05-12 NOTE — ED Triage Notes (Signed)
States she feel like she has a UTI. Urgency and frequency.

## 2019-05-12 NOTE — ED Provider Notes (Signed)
[facility] Provider Note   CSN: [id number] Arrival date & time: 05/12/19  1953     History Chief Complaint  Patient presents with  . Recurrent UTI    Yolanda Carr is a 59 y.o. female.  Patient is a 59 year old female with a history of recurrent UTIs and thyroid disease who presents today with 2 days of dysuria, urgency and frequency.  She denies any fever, nausea, vomiting, back pain, abdominal pain.  The last UTI she had was approximately 1 year ago and states she has had issues where she has been given other medications and then had to return to get Cipro because it seemed to be the only thing that treated her UTIs.  The history is provided by the patient.       Past Medical History:  Diagnosis Date  . History of recurrent UTIs   . Thyroid disease     There are no problems to display for this patient.   Past Surgical History:  Procedure Laterality Date  . BIOPSY BREAST    . CESAREAN SECTION  1996     OB History    Gravida  0   Para  0   Term  0   Preterm  0   AB  0   Living        SAB  0   TAB  0   Ectopic  0   Multiple      Live Births              No family history on file.  Social History   Tobacco Use  . Smoking status: Current Some Day Smoker  . Smokeless tobacco: Never Used  Substance Use Topics  . Alcohol use: Yes    Comment: socially   . Drug use: No    Home Medications Prior to Admission medications   Medication Sig Start Date End Date Taking? Authorizing Provider  benzonatate (TESSALON) 100 MG capsule Take 1 capsule (100 mg total) by mouth every 8 (eight) hours. 05/31/16   Bettey Costa, PA  ciprofloxacin (CIPRO) 500 MG tablet Take 1 tablet (500 mg total) by mouth every 12 (twelve) hours. 05/12/19   Blanchie Dessert, MD  fluconazole (DIFLUCAN) 200 MG tablet Take 1 tablet (200 mg total) by mouth daily for 3 days. 05/12/19 05/15/19  Blanchie Dessert, MD  fluticasone (FLONASE)  50 MCG/ACT nasal spray Place 2 sprays into both nostrils daily. 05/30/16   Julianne Rice, MD  HYDROcodone-acetaminophen (NORCO) 5-325 MG tablet Take 1-2 tablets by mouth every 6 (six) hours as needed. 10/03/17   Veryl Speak, MD  ibuprofen (ADVIL,MOTRIN) 800 MG tablet Take 1 tablet (800 mg total) by mouth 3 (three) times daily. 10/08/17   Deno Etienne, DO  loratadine (CLARITIN) 10 MG tablet Take 1 tablet (10 mg total) by mouth daily. 05/30/16   Julianne Rice, MD  Methenamine-Sodium Salicylate (CYSTEX PO) Take 2 tablets by mouth once as needed. For UTI symptoms      [provider]  naproxen (NAPROSYN) 500 MG tablet Take 1 tablet (500 mg total) by mouth 2 (two) times daily with a meal. 03/21/17   Caccavale, Sophia, PA-C  nystatin-triamcinolone (MYCOLOG II) cream Apply to affected area twice daily until resolved 02/18/17   Horton, Barbette Hair, MD  omeprazole (PRILOSEC) 20 MG capsule Take 1 capsule (20 mg total) by mouth daily. 07/20/15   Horton, Barbette Hair, MD  ondansetron (ZOFRAN ODT) 4 MG disintegrating tablet Take 1 tablet (4  mg total) by mouth every 8 (eight) hours as needed for nausea or vomiting. 07/20/15   Horton, Mayer Masker, MD  polyethylene glycol (MIRALAX / GLYCOLAX) packet Take 17 g by mouth daily. 10/23/14   Loren Racer, MD  tamoxifen (NOLVADEX) 10 MG tablet Take 10 mg by mouth 2 (two) times daily.      [provider]    Allergies    Estrogens and Sulfa antibiotics  Review of Systems   Review of Systems  All other systems reviewed and are negative.   Physical Exam Updated Vital Signs BP (!) 141/62   Pulse 83   Temp 98.2 F (36.8 C)   Resp 18   Ht 5\' 4"  (1.626 m)   Wt 77.1 kg   LMP 07/04/2012   SpO2 98%   BMI 29.18 kg/m   Physical Exam Vitals and nursing note reviewed.  Constitutional:      General: She is not in acute distress.    Appearance: Normal appearance. She is well-developed and normal weight.  HENT:     Head: Normocephalic and atraumatic.    Eyes:     Pupils: Pupils are equal, round, and reactive to light.  Cardiovascular:     Rate and Rhythm: Normal rate and regular rhythm.     Heart sounds: Normal heart sounds. No murmur. No friction rub.  Pulmonary:     Effort: Pulmonary effort is normal.     Breath sounds: Normal breath sounds. No wheezing or rales.  Abdominal:     General: Bowel sounds are normal. There is no distension.     Palpations: Abdomen is soft.     Tenderness: There is no abdominal tenderness. There is no guarding or rebound.  Musculoskeletal:        General: No tenderness. Normal range of motion.     Comments: No edema  Skin:    General: Skin is warm and dry.     Findings: No rash.  Neurological:     Mental Status: She is alert and oriented to person, place, and time.     Cranial Nerves: No cranial nerve deficit.  Psychiatric:        Behavior: Behavior normal.     ED Results / Procedures / Treatments   Labs (all labs ordered are listed, but only abnormal results are displayed) Labs Reviewed  URINALYSIS, ROUTINE W REFLEX MICROSCOPIC - Abnormal; Notable for the following components:      Result Value   Specific Gravity, Urine <1.005 (*)    Hgb urine dipstick LARGE (*)    Leukocytes,Ua SMALL (*)    All other components within normal limits  URINALYSIS, MICROSCOPIC (REFLEX) - Abnormal; Notable for the following components:   Bacteria, UA MANY (*)    All other components within normal limits  URINE CULTURE    EKG None  Radiology No results found.  Procedures Procedures (including critical care time)  Medications Ordered in ED Medications - No data to display  ED Course  I have reviewed the triage vital signs and the nursing notes.  Pertinent labs & imaging results that were available during my care of the patient were reviewed by me and considered in my medical decision making (see chart for details).    MDM Rules/Calculators/A&P                     Patient presenting with  symptoms consistent with UTI with UA that is consistent with infection.  Patient does have history of recurrent  UTIs and states in the past she has had issues with being resistant to Macrobid and maybe one other antibiotic and normally would have to be changed to Cipro.  Patient urine culture was sent and she was given a 5-day course of Cipro.  She was instructed to follow-up with her doctor if symptoms are not improving.  Final Clinical Impression(s) / ED Diagnoses Final diagnoses:  Acute cystitis with hematuria    Rx / DC Orders ED Discharge Orders         Ordered    ciprofloxacin (CIPRO) 500 MG tablet  Every 12 hours     05/12/19 2055    fluconazole (DIFLUCAN) 200 MG tablet  Daily     05/12/19 2055           Gwyneth Sprout, MD 05/12/19 2059

## 2019-05-13 LAB — URINE CULTURE: Culture: NO GROWTH

## 2019-06-24 ENCOUNTER — Other Ambulatory Visit: Payer: Self-pay

## 2019-06-24 ENCOUNTER — Encounter (HOSPITAL_BASED_OUTPATIENT_CLINIC_OR_DEPARTMENT_OTHER): Payer: Self-pay | Admitting: Emergency Medicine

## 2019-06-24 ENCOUNTER — Emergency Department (HOSPITAL_BASED_OUTPATIENT_CLINIC_OR_DEPARTMENT_OTHER)
Admission: EM | Admit: 2019-06-24 | Discharge: 2019-06-24 | Disposition: A | Discharge status: Home / Self Care | Attending: Emergency Medicine | Admitting: Emergency Medicine

## 2019-06-24 DIAGNOSIS — R300 Dysuria: Secondary | ICD-10-CM | POA: Diagnosis present

## 2019-06-24 DIAGNOSIS — F17200 Nicotine dependence, unspecified, uncomplicated: Secondary | ICD-10-CM | POA: Diagnosis not present

## 2019-06-24 DIAGNOSIS — N390 Urinary tract infection, site not specified: Secondary | ICD-10-CM | POA: Insufficient documentation

## 2019-06-24 DIAGNOSIS — Z79899 Other long term (current) drug therapy: Secondary | ICD-10-CM | POA: Insufficient documentation

## 2019-06-24 LAB — URINALYSIS, MICROSCOPIC (REFLEX)
RBC / HPF: >50 RBC/hpf (ref 0–5)
WBC, UA: >50 WBC/hpf (ref 0–5)

## 2019-06-24 LAB — URINALYSIS, ROUTINE W REFLEX MICROSCOPIC
Glucose, UA: NEGATIVE mg/dL
Ketones, ur: NEGATIVE mg/dL
Nitrite: NEGATIVE
Protein, ur: 100 mg/dL — AB
Specific Gravity, Urine: 1.02 (ref 1.005–1.030)
pH: 8.5 — ABNORMAL HIGH (ref 5.0–8.0)

## 2019-06-24 MED ORDER — PHENAZOPYRIDINE HCL 200 MG PO TABS: 200.0000 mg | ORAL_TABLET | Freq: Three times a day (TID) | ORAL | 0 refills | Status: AC

## 2019-06-24 MED ORDER — CIPROFLOXACIN HCL 500 MG PO TABS: 500.0000 mg | ORAL_TABLET | Freq: Two times a day (BID) | ORAL | 0 refills | Status: AC

## 2019-06-24 NOTE — ED Provider Notes (Signed)
[facility] Provider Note   CSN: [id number] Arrival date & time: 06/24/19  1230     History Chief Complaint  Patient presents with  . Dysuria    Yolanda Carr is a 59 y.o. female with medical history significant for thyroid disease, history of recurrent UTIs who presents for evaluation of dysuria.  Patient states he used a bath bomb yesterday evening.  Patient states she typically feels showers.  Patient states after that she woke up in the middle of the night and had some burning with urination.  Has felt she has had mucus in her urine with her last urination here in the emergency department.  She denies any fever, chills, nausea, vomiting, chest pain, shortness of breath, abdominal pain, rashes, lesions, vaginal irritation or pruritus.  Not take anything for symptoms.  States symptoms similar with prior UTIs.  No hematuria or flank pain.  Denies additional aggravating alleviating factors  History obtained from patient and past medical record. No interpreter is used  HPI     Past Medical History:  Diagnosis Date  . History of recurrent UTIs   . Thyroid disease     There are no problems to display for this patient.   Past Surgical History:  Procedure Laterality Date  . BIOPSY BREAST    . CESAREAN SECTION  1996     OB History    Gravida  0   Para  0   Term  0   Preterm  0   AB  0   Living        SAB  0   TAB  0   Ectopic  0   Multiple      Live Births              No family history on file.  Social History   Tobacco Use  . Smoking status: Current Some Day Smoker  . Smokeless tobacco: Never Used  Substance Use Topics  . Alcohol use: Yes    Comment: socially   . Drug use: No    Home Medications Prior to Admission medications   Medication Sig Start Date End Date Taking? Authorizing Provider  benzonatate (TESSALON) 100 MG capsule Take 1 capsule (100 mg total) by mouth every 8 (eight) hours. 05/31/16    Alvina Chou, PA  ciprofloxacin (CIPRO) 500 MG tablet Take 1 tablet (500 mg total) by mouth every 12 (twelve) hours for 7 days. 06/24/19 07/01/19  [place] Latulippe A, PA-C  fluticasone (FLONASE) 50 MCG/ACT nasal spray Place 2 sprays into both nostrils daily. 05/30/16   Loren Racer, MD  HYDROcodone-acetaminophen (NORCO) 5-325 MG tablet Take 1-2 tablets by mouth every 6 (six) hours as needed. 10/03/17   Geoffery Lyons, MD  ibuprofen (ADVIL,MOTRIN) 800 MG tablet Take 1 tablet (800 mg total) by mouth 3 (three) times daily. 10/08/17   Melene Plan, DO  loratadine (CLARITIN) 10 MG tablet Take 1 tablet (10 mg total) by mouth daily. 05/30/16   Loren Racer, MD  Methenamine-Sodium Salicylate (CYSTEX PO) Take 2 tablets by mouth once as needed. For UTI symptoms      [provider]  naproxen (NAPROSYN) 500 MG tablet Take 1 tablet (500 mg total) by mouth 2 (two) times daily with a meal. 03/21/17   Caccavale, Sophia, PA-C  nystatin-triamcinolone (MYCOLOG II) cream Apply to affected area twice daily until resolved 02/18/17   Horton, Mayer Masker, MD  omeprazole (PRILOSEC) 20 MG capsule Take 1 capsule (20 mg total)  by mouth daily. 07/20/15   Horton, Barbette Hair, MD  ondansetron (ZOFRAN ODT) 4 MG disintegrating tablet Take 1 tablet (4 mg total) by mouth every 8 (eight) hours as needed for nausea or vomiting. 07/20/15   Horton, Barbette Hair, MD  phenazopyridine (PYRIDIUM) 200 MG tablet Take 1 tablet (200 mg total) by mouth 3 (three) times daily. 06/24/19   Nickolas Chalfin A, PA-C  polyethylene glycol (MIRALAX / GLYCOLAX) packet Take 17 g by mouth daily. 10/23/14   Julianne Rice, MD  tamoxifen (NOLVADEX) 10 MG tablet Take 10 mg by mouth 2 (two) times daily.      [provider]    Allergies    Estrogens and Sulfa antibiotics  Review of Systems   Review of Systems  Constitutional: Negative.   HENT: Negative.   Respiratory: Negative.   Cardiovascular: Negative.   Gastrointestinal: Negative.    Genitourinary: Positive for dysuria. Negative for decreased urine volume, difficulty urinating, flank pain, frequency, hematuria, menstrual problem, pelvic pain, urgency, vaginal bleeding, vaginal discharge and vaginal pain.  Musculoskeletal: Negative.   Skin: Negative.   Neurological: Negative.   All other systems reviewed and are negative.   Physical Exam Updated Vital Signs BP 128/74 (BP Location: Right Arm)   Pulse 87   Temp 97.9 F (36.6 C) (Oral)   Resp 16   Ht 5\' 5"  (1.651 m)   Wt 76.2 kg   LMP 07/04/2012   SpO2 99%   BMI 27.96 kg/m   Physical Exam Vitals and nursing note reviewed.  Constitutional:      General: She is not in acute distress.    Appearance: She is well-developed. She is not ill-appearing or toxic-appearing.  HENT:     Head: Normocephalic and atraumatic.     Nose: Nose normal.     Mouth/Throat:     Mouth: Mucous membranes are moist.     Pharynx: Oropharynx is clear.  Eyes:     Pupils: Pupils are equal, round, and reactive to light.  Cardiovascular:     Rate and Rhythm: Normal rate.     Pulses: Normal pulses.     Heart sounds: Normal heart sounds.  Pulmonary:     Effort: No respiratory distress.     Breath sounds: Normal breath sounds.  Abdominal:     General: Bowel sounds are normal. There is no distension.  Musculoskeletal:        General: Normal range of motion.     Cervical back: Normal range of motion.  Skin:    General: Skin is warm and dry.     Capillary Refill: Capillary refill takes less than 2 seconds.  Neurological:     Mental Status: She is alert.    ED Results / Procedures / Treatments   Labs (all labs ordered are listed, but only abnormal results are displayed) Labs Reviewed  URINALYSIS, ROUTINE W REFLEX MICROSCOPIC - Abnormal; Notable for the following components:      Result Value   APPearance CLOUDY (*)    pH 8.5 (*)    Hgb urine dipstick LARGE (*)    Bilirubin Urine SMALL (*)    Protein, ur 100 (*)     Leukocytes,Ua MODERATE (*)    All other components within normal limits  URINALYSIS, MICROSCOPIC (REFLEX) - Abnormal; Notable for the following components:   Bacteria, UA FEW (*)    All other components within normal limits  URINE CULTURE    EKG None  Radiology No results found.  Procedures Procedures (including  critical care time)  Medications Ordered in ED Medications - No data to display  ED Course  I have reviewed the triage vital signs and the nursing notes.  Pertinent labs & imaging results that were available during my care of the patient were reviewed by me and considered in my medical decision making (see chart for details).  59 year old who is otherwise well presents for evaluation of dysuria.  She of frequent UTIs.  States feels similar.  Symptoms began after using bath bombs yesterday evening.  She is afebrile, nonseptic, not ill-appearing.  Abdomen soft, nontender.  She denies any vaginal irritation, pruritus or discharge.  No concerns for STDs.  Denies flank pain or hematuria.  Will obtain urinalysis.  Seen here in January for similar symptoms however culture did not grow anything at that time.  UA positive for UTI. Will culture. Stable for dc home.  The patient has been appropriately medically screened and/or stabilized in the ED. I have low suspicion for any other emergent medical condition which would require further screening, evaluation or treatment in the ED or require inpatient management.     MDM Rules/Calculators/A&P                       Final Clinical Impression(s) / ED Diagnoses Final diagnoses:  Lower urinary tract infectious disease    Rx / DC Orders ED Discharge Orders         Ordered    ciprofloxacin (CIPRO) 500 MG tablet  Every 12 hours     06/24/19 1330    phenazopyridine (PYRIDIUM) 200 MG tablet  3 times daily     06/24/19 1330           Zailyn Thoennes A, PA-C 06/24/19 1331    Jacalyn Lefevre, MD 06/25/19 [phone]

## 2019-06-24 NOTE — ED Triage Notes (Signed)
Dysuria since early morning. States she recently used a bath bomb when she typically takes showers.

## 2019-06-26 LAB — URINE CULTURE: Culture: 80000 — AB

## 2019-06-28 ENCOUNTER — Telehealth: Payer: Self-pay | Admitting: Emergency Medicine

## 2019-06-28 NOTE — Telephone Encounter (Signed)
Post ED Visit - Positive Culture Follow-up  Culture report reviewed by antimicrobial stewardship pharmacist: [facility] []  [facility], Pharm.D. []  [facility], .D., BCPS AQ-ID []  Celedonio Miyamoto, Pharm.D., BCPS []  [address], Pharm.D., BCPS []  [place], Garvin Fila.D., BCPS, AAHIVP []  , Pharm.D., BCPS, AAHIVP []  Georgina Pillion, PharmD, BCPS []  , PharmD, BCPS []  [place], PharmD, BCPS [x]  [place], PharmD []  , PharmD, BCPS []  Estella Husk, PharmD  Pharmacy Team []  Lysle Pearl, PharmD []  , PharmD []  Phillips Climes, PharmD []  , Rph []  [employer]) , PharmD []  Joaquim Lai, PharmD []  , PharmD []  Mervyn Gay, PharmD []  , PharmD []  Vinnie Level, PharmD []  [facility], PharmD []  , PharmD []  Len Childs, PharmD   Positive urine culture Treated with Ciprofloxacin, organism sensitive to the same and no further patient follow-up is required at this time.  Benedicta Sultan 06/28/2019, 3:15 PM

## 2020-05-06 ENCOUNTER — Other Ambulatory Visit: Payer: Self-pay | Admitting: Family Medicine
